# Patient Record
Sex: Male | Born: 1950
Health system: Southern US, Community
[De-identification: ages and names within clinical notes are randomized; demographics above are authoritative.]

## PROBLEM LIST (undated history)

## (undated) DIAGNOSIS — R7303 Prediabetes: Secondary | ICD-10-CM

## (undated) DIAGNOSIS — IMO0002 Reserved for concepts with insufficient information to code with codable children: Secondary | ICD-10-CM

## (undated) DIAGNOSIS — M1A9XX0 Chronic gout, unspecified, without tophus (tophi): Secondary | ICD-10-CM

## (undated) DIAGNOSIS — K449 Diaphragmatic hernia without obstruction or gangrene: Secondary | ICD-10-CM

## (undated) DIAGNOSIS — F4310 Post-traumatic stress disorder, unspecified: Secondary | ICD-10-CM

## (undated) DIAGNOSIS — M1990 Unspecified osteoarthritis, unspecified site: Secondary | ICD-10-CM

## (undated) DIAGNOSIS — G4733 Obstructive sleep apnea (adult) (pediatric): Secondary | ICD-10-CM

## (undated) DIAGNOSIS — I10 Essential (primary) hypertension: Secondary | ICD-10-CM

## (undated) DIAGNOSIS — J329 Chronic sinusitis, unspecified: Secondary | ICD-10-CM

## (undated) DIAGNOSIS — M6530 Trigger finger, unspecified finger: Secondary | ICD-10-CM

## (undated) DIAGNOSIS — J302 Other seasonal allergic rhinitis: Secondary | ICD-10-CM

## (undated) DIAGNOSIS — Z87898 Personal history of other specified conditions: Secondary | ICD-10-CM

## (undated) DIAGNOSIS — N200 Calculus of kidney: Secondary | ICD-10-CM

## (undated) DIAGNOSIS — M5136 Other intervertebral disc degeneration, lumbar region: Secondary | ICD-10-CM

## (undated) DIAGNOSIS — G8929 Other chronic pain: Secondary | ICD-10-CM

## (undated) DIAGNOSIS — K219 Gastro-esophageal reflux disease without esophagitis: Secondary | ICD-10-CM

## (undated) DIAGNOSIS — E785 Hyperlipidemia, unspecified: Secondary | ICD-10-CM

## (undated) DIAGNOSIS — R42 Dizziness and giddiness: Secondary | ICD-10-CM

## (undated) DIAGNOSIS — D649 Anemia, unspecified: Secondary | ICD-10-CM

## (undated) DIAGNOSIS — E669 Obesity, unspecified: Secondary | ICD-10-CM

## (undated) DIAGNOSIS — F1021 Alcohol dependence, in remission: Secondary | ICD-10-CM

## (undated) DIAGNOSIS — M51369 Other intervertebral disc degeneration, lumbar region without mention of lumbar back pain or lower extremity pain: Secondary | ICD-10-CM

## (undated) DIAGNOSIS — M259 Joint disorder, unspecified: Secondary | ICD-10-CM

## (undated) DIAGNOSIS — M545 Low back pain, unspecified: Secondary | ICD-10-CM

## (undated) DIAGNOSIS — F900 Attention-deficit hyperactivity disorder, predominantly inattentive type: Secondary | ICD-10-CM

## (undated) HISTORY — DX: Dizziness and giddiness: R42

## (undated) HISTORY — DX: Joint disorder, unspecified: M25.9

## (undated) HISTORY — PX: OTHER SURGICAL HISTORY: SHX169

## (undated) HISTORY — DX: Anemia, unspecified: D64.9

## (undated) HISTORY — DX: Unspecified osteoarthritis, unspecified site: M19.90

## (undated) HISTORY — DX: Post-traumatic stress disorder, unspecified: F43.10

## (undated) HISTORY — DX: Hyperlipidemia, unspecified: E78.5

## (undated) HISTORY — PX: LUMBAR MICRODISCECTOMY: SHX99

## (undated) HISTORY — DX: Chronic sinusitis, unspecified: J32.9

## (undated) HISTORY — PX: WISDOM TOOTH EXTRACTION: SHX21

## (undated) HISTORY — DX: Other seasonal allergic rhinitis: J30.2

## (undated) HISTORY — DX: Reserved for concepts with insufficient information to code with codable children: IMO0002

## (undated) HISTORY — DX: Obesity, unspecified: E66.9

## (undated) HISTORY — DX: Calculus of kidney: N20.0

## (undated) HISTORY — DX: Attention-deficit hyperactivity disorder, predominantly inattentive type: F90.0

---

## 1993-06-24 HISTORY — PX: LUMBAR FUSION: SHX111

## 1998-05-17 ENCOUNTER — Encounter: Payer: Self-pay | Admitting: Neurological Surgery

## 1998-05-17 ENCOUNTER — Ambulatory Visit (HOSPITAL_COMMUNITY): Admission: RE | Admit: 1998-05-17 | Discharge: 1998-05-17 | Payer: Self-pay | Admitting: Neurological Surgery

## 1998-05-31 ENCOUNTER — Encounter: Payer: Self-pay | Admitting: Neurological Surgery

## 1998-05-31 ENCOUNTER — Ambulatory Visit (HOSPITAL_COMMUNITY): Admission: RE | Admit: 1998-05-31 | Discharge: 1998-05-31 | Payer: Self-pay | Admitting: Neurological Surgery

## 1998-06-14 ENCOUNTER — Encounter: Payer: Self-pay | Admitting: Neurological Surgery

## 1998-06-14 ENCOUNTER — Ambulatory Visit (HOSPITAL_COMMUNITY): Admission: RE | Admit: 1998-06-14 | Discharge: 1998-06-14 | Payer: Self-pay | Admitting: Neurological Surgery

## 1998-12-21 ENCOUNTER — Ambulatory Visit (HOSPITAL_BASED_OUTPATIENT_CLINIC_OR_DEPARTMENT_OTHER): Admission: RE | Admit: 1998-12-21 | Discharge: 1998-12-21 | Payer: Self-pay | Admitting: *Deleted

## 2000-05-10 ENCOUNTER — Ambulatory Visit (HOSPITAL_COMMUNITY): Admission: RE | Admit: 2000-05-10 | Discharge: 2000-05-10 | Payer: Self-pay | Admitting: Neurological Surgery

## 2000-05-16 ENCOUNTER — Encounter: Payer: Self-pay | Admitting: Neurological Surgery

## 2000-05-16 ENCOUNTER — Encounter: Admission: RE | Admit: 2000-05-16 | Discharge: 2000-05-16 | Payer: Self-pay | Admitting: Neurological Surgery

## 2000-06-24 HISTORY — PX: FOOT SURGERY: SHX648

## 2005-08-06 ENCOUNTER — Encounter: Admission: RE | Admit: 2005-08-06 | Discharge: 2005-08-06 | Payer: Self-pay | Admitting: Neurology

## 2006-03-19 ENCOUNTER — Encounter: Admission: RE | Admit: 2006-03-19 | Discharge: 2006-03-19 | Payer: Self-pay | Admitting: Family Medicine

## 2006-03-24 ENCOUNTER — Ambulatory Visit: Payer: Self-pay

## 2006-03-24 ENCOUNTER — Encounter: Payer: Self-pay | Admitting: Internal Medicine

## 2006-06-18 ENCOUNTER — Encounter: Admission: RE | Admit: 2006-06-18 | Discharge: 2006-06-30 | Payer: Self-pay | Admitting: Family Medicine

## 2006-06-24 HISTORY — PX: SHOULDER ARTHROSCOPY: SHX128

## 2007-08-01 ENCOUNTER — Encounter: Admission: RE | Admit: 2007-08-01 | Discharge: 2007-08-01 | Payer: Self-pay | Admitting: Neurological Surgery

## 2007-08-19 ENCOUNTER — Ambulatory Visit (HOSPITAL_COMMUNITY): Admission: RE | Admit: 2007-08-19 | Discharge: 2007-08-19 | Payer: Self-pay | Admitting: Neurological Surgery

## 2009-03-15 ENCOUNTER — Encounter (INDEPENDENT_AMBULATORY_CARE_PROVIDER_SITE_OTHER): Payer: Self-pay | Admitting: *Deleted

## 2009-03-22 ENCOUNTER — Ambulatory Visit: Payer: Self-pay | Admitting: Gastroenterology

## 2009-04-05 ENCOUNTER — Ambulatory Visit: Payer: Self-pay | Admitting: Gastroenterology

## 2009-08-16 ENCOUNTER — Encounter: Admission: RE | Admit: 2009-08-16 | Discharge: 2009-08-16 | Payer: Self-pay | Admitting: Family Medicine

## 2010-02-23 ENCOUNTER — Encounter: Admission: RE | Admit: 2010-02-23 | Discharge: 2010-02-23 | Payer: Self-pay | Admitting: Family Medicine

## 2011-03-25 DIAGNOSIS — Z87442 Personal history of urinary calculi: Secondary | ICD-10-CM

## 2011-03-25 DIAGNOSIS — N200 Calculus of kidney: Secondary | ICD-10-CM

## 2011-03-25 HISTORY — DX: Personal history of urinary calculi: Z87.442

## 2011-03-25 HISTORY — DX: Calculus of kidney: N20.0

## 2011-04-10 ENCOUNTER — Ambulatory Visit
Admission: RE | Admit: 2011-04-10 | Discharge: 2011-04-10 | Disposition: A | Discharge status: Home / Self Care | Payer: Medicare Other | Source: Ambulatory Visit | Attending: Family Medicine | Admitting: Family Medicine

## 2011-04-10 ENCOUNTER — Other Ambulatory Visit: Payer: Self-pay | Admitting: Family Medicine

## 2011-06-27 DIAGNOSIS — R5381 Other malaise: Secondary | ICD-10-CM | POA: Diagnosis not present

## 2011-06-27 DIAGNOSIS — I10 Essential (primary) hypertension: Secondary | ICD-10-CM | POA: Diagnosis not present

## 2011-11-15 DIAGNOSIS — IMO0002 Reserved for concepts with insufficient information to code with codable children: Secondary | ICD-10-CM | POA: Diagnosis not present

## 2011-11-15 DIAGNOSIS — M5137 Other intervertebral disc degeneration, lumbosacral region: Secondary | ICD-10-CM | POA: Diagnosis not present

## 2011-11-29 DIAGNOSIS — IMO0002 Reserved for concepts with insufficient information to code with codable children: Secondary | ICD-10-CM | POA: Diagnosis not present

## 2011-11-29 DIAGNOSIS — M5137 Other intervertebral disc degeneration, lumbosacral region: Secondary | ICD-10-CM | POA: Diagnosis not present

## 2011-11-29 DIAGNOSIS — M545 Low back pain, unspecified: Secondary | ICD-10-CM | POA: Diagnosis not present

## 2012-04-17 DIAGNOSIS — F988 Other specified behavioral and emotional disorders with onset usually occurring in childhood and adolescence: Secondary | ICD-10-CM | POA: Diagnosis not present

## 2012-06-25 DIAGNOSIS — E559 Vitamin D deficiency, unspecified: Secondary | ICD-10-CM | POA: Diagnosis not present

## 2012-06-25 DIAGNOSIS — F988 Other specified behavioral and emotional disorders with onset usually occurring in childhood and adolescence: Secondary | ICD-10-CM | POA: Diagnosis not present

## 2012-06-25 DIAGNOSIS — F329 Major depressive disorder, single episode, unspecified: Secondary | ICD-10-CM | POA: Diagnosis not present

## 2012-06-25 DIAGNOSIS — E7800 Pure hypercholesterolemia, unspecified: Secondary | ICD-10-CM | POA: Diagnosis not present

## 2012-06-25 DIAGNOSIS — I10 Essential (primary) hypertension: Secondary | ICD-10-CM | POA: Diagnosis not present

## 2012-09-23 ENCOUNTER — Other Ambulatory Visit: Payer: Self-pay

## 2012-09-23 MED ORDER — METHYLPHENIDATE HCL 10 MG PO TABS: 10.0000 mg | ORAL_TABLET | Freq: Every day | ORAL | Status: DC

## 2012-09-23 NOTE — Telephone Encounter (Signed)
Patient calling to request refill on Ritalin.  He would like the rx mailed to his [address].

## 2012-10-12 DIAGNOSIS — M545 Low back pain, unspecified: Secondary | ICD-10-CM | POA: Diagnosis not present

## 2012-11-05 DIAGNOSIS — N400 Enlarged prostate without lower urinary tract symptoms: Secondary | ICD-10-CM | POA: Diagnosis not present

## 2012-12-16 DIAGNOSIS — E559 Vitamin D deficiency, unspecified: Secondary | ICD-10-CM | POA: Diagnosis not present

## 2012-12-16 DIAGNOSIS — R7301 Impaired fasting glucose: Secondary | ICD-10-CM | POA: Diagnosis not present

## 2012-12-16 DIAGNOSIS — I10 Essential (primary) hypertension: Secondary | ICD-10-CM | POA: Diagnosis not present

## 2012-12-16 DIAGNOSIS — E785 Hyperlipidemia, unspecified: Secondary | ICD-10-CM | POA: Diagnosis not present

## 2012-12-16 DIAGNOSIS — Z125 Encounter for screening for malignant neoplasm of prostate: Secondary | ICD-10-CM | POA: Diagnosis not present

## 2013-01-01 ENCOUNTER — Other Ambulatory Visit: Payer: Self-pay

## 2013-01-01 MED ORDER — METHYLPHENIDATE HCL 10 MG PO TABS: 10.0000 mg | ORAL_TABLET | Freq: Every day | ORAL | Status: DC

## 2013-01-01 NOTE — Telephone Encounter (Signed)
Patient called, left message requesting a refill on Ritalin.  Call back number with any questions is [phone].

## 2013-02-14 ENCOUNTER — Other Ambulatory Visit: Payer: Self-pay | Admitting: Family Medicine

## 2013-02-14 DIAGNOSIS — M549 Dorsalgia, unspecified: Secondary | ICD-10-CM

## 2013-02-24 ENCOUNTER — Other Ambulatory Visit: Payer: Medicare Other

## 2013-03-02 DIAGNOSIS — B351 Tinea unguium: Secondary | ICD-10-CM | POA: Diagnosis not present

## 2013-03-02 DIAGNOSIS — L600 Ingrowing nail: Secondary | ICD-10-CM | POA: Diagnosis not present

## 2013-03-02 DIAGNOSIS — M79609 Pain in unspecified limb: Secondary | ICD-10-CM | POA: Diagnosis not present

## 2013-03-31 ENCOUNTER — Encounter: Payer: Self-pay | Admitting: Nurse Practitioner

## 2013-04-07 ENCOUNTER — Encounter (INDEPENDENT_AMBULATORY_CARE_PROVIDER_SITE_OTHER): Payer: Self-pay

## 2013-04-07 ENCOUNTER — Encounter: Payer: Self-pay | Admitting: Nurse Practitioner

## 2013-04-07 ENCOUNTER — Ambulatory Visit (INDEPENDENT_AMBULATORY_CARE_PROVIDER_SITE_OTHER): Payer: Medicare Other | Admitting: Nurse Practitioner

## 2013-04-07 VITALS — BP 132/88 | HR 78 | Temp 97.6°F | Ht 73.0 in | Wt 233.0 lb

## 2013-04-07 DIAGNOSIS — R42 Dizziness and giddiness: Secondary | ICD-10-CM | POA: Diagnosis not present

## 2013-04-07 DIAGNOSIS — M5126 Other intervertebral disc displacement, lumbar region: Secondary | ICD-10-CM

## 2013-04-07 DIAGNOSIS — H8309 Labyrinthitis, unspecified ear: Secondary | ICD-10-CM

## 2013-04-07 DIAGNOSIS — I10 Essential (primary) hypertension: Secondary | ICD-10-CM | POA: Insufficient documentation

## 2013-04-07 DIAGNOSIS — M5137 Other intervertebral disc degeneration, lumbosacral region: Secondary | ICD-10-CM | POA: Diagnosis not present

## 2013-04-07 DIAGNOSIS — F988 Other specified behavioral and emotional disorders with onset usually occurring in childhood and adolescence: Secondary | ICD-10-CM | POA: Diagnosis not present

## 2013-04-07 MED ORDER — METHYLPHENIDATE HCL 10 MG PO TABS: 10.0000 mg | ORAL_TABLET | Freq: Every day | ORAL | Status: DC

## 2013-04-07 NOTE — Progress Notes (Signed)
[facility]  PATIENT: Stephen York DOB: [birth year]   REASON FOR VISIT: follow up HISTORY FROM: patient  HISTORY OF PRESENT ILLNESS: UPDATE 04/07/13 (LL): Stephen York returns for yearly follow up for ADD.  He has no complaints.   04/17/12: Stephen York is a 62 year old male here for his yearly follow up for ADD.  He reports his concentration has been better with his Ritalin.    PRIOR HPI (Dr. Vickey Huger): He has a history of hearing loss and ringing in the ear,  he has chronic sinus problems.  He sees a dermatologist for his eczema and rash on his face. He also has had three back surgeries in the past and was found six to eight months ago to have another ruptured disc, however, he is being treated conservatively by Dr. Danielle Dess.  He has a history of high blood pressure, high cholesterol and posttraumatic stress disorder.  Review of Systems  Out of a complete 14 system review, the patient complains of only the following symptoms, and all other reviewed systems are negative.  Respiratory: Snoring   Endocrine: Feeling hot   Sleep: Snoring   ENT: Hearing loss   Ringing in ears   Skin: Rash   Birthmarks  Itching   moles   Musculoskeletal: Joint pain   Aching muscles   Allergy/Immunology: Allergies   Runny nose   Genitourinary: urination problems  Impotence   Neurological: memory loss  ALLERGIES: Allergies  Allergen Reactions  . Lipitor [Atorvastatin]     HOME MEDICATIONS: Outpatient Prescriptions Prior to Visit  Medication Sig Dispense Refill  . ALPRAZolam (XANAX) 0.5 MG tablet Take 0.5 mg by mouth at bedtime as needed for sleep.      Marland Kitchen amLODipine (NORVASC) 10 MG tablet Take 10 mg by mouth daily.      Marland Kitchen aspirin 81 MG chewable tablet Chew 81 mg by mouth daily.      . cholecalciferol (VITAMIN D) 1000 UNITS tablet Take 1,000 Units by mouth daily.      . colesevelam (WELCHOL) 625 MG tablet Take 1,875 mg by mouth 2 (two) times daily with a meal.      . cyclobenzaprine  (FLEXERIL) 10 MG tablet Take 10 mg by mouth 3 (three) times daily as needed for muscle spasms.      . fenofibrate 160 MG tablet Take 160 mg by mouth daily.      . hydrochlorothiazide (HYDRODIURIL) 25 MG tablet Take 25 mg by mouth daily.      Marland Kitchen lisinopril (PRINIVIL,ZESTRIL) 10 MG tablet Take 10 mg by mouth daily. Take half tablet daily.      . meloxicam (MOBIC) 15 MG tablet Take 15 mg by mouth daily.      . methocarbamol (ROBAXIN) 500 MG tablet Take 500 mg by mouth 3 (three) times daily.      . sertraline (ZOLOFT) 25 MG tablet Take 25 mg by mouth daily.      . vitamin C (ASCORBIC ACID) 500 MG tablet Take 500 mg by mouth daily.      . vitamin E 1000 UNIT capsule Take 1,000 Units by mouth daily.      . methylphenidate (RITALIN) 10 MG tablet Take 1 tablet (10 mg total) by mouth daily. BRAND MEDICALLY NECESSARY  90 tablet  0  . Flaxseed, Linseed, (FLAX SEED OIL) 1000 MG CAPS Take 1,000 capsules by mouth daily.       No facility-administered medications prior to visit.    PAST MEDICAL HISTORY: Past  Medical History  Diagnosis Date  . Kidney stones october 2012    passed it  . Hyperlipidemia   . PTSD (post-traumatic stress disorder)   . Obesity   . Dizziness   . Degenerative disk disease   . Chronic sinusitis   . ADD (attention deficit hyperactivity disorder, inattentive type)     PAST SURGICAL HISTORY: Past Surgical History  Procedure Laterality Date  . Back surgeries      3    FAMILY HISTORY: Family History  Problem Relation Age of Onset  .       SOCIAL HISTORY: History   Social History  . Marital Status: Married    Spouse Name: Samara Deist    Number of Children: 2  . Years of Education: 12   Occupational History  . retired    Social History Main Topics  . Smoking status: Former Smoker    Quit date: 09/29/1992  . Smokeless tobacco: Not on file  . Alcohol Use: No  . Drug Use: No  . Sexual Activity: No   Other Topics Concern  . Not on file   Social History  Narrative  . No narrative on file     PHYSICAL EXAM  Filed Vitals:   04/07/13 1021  BP: 132/88  Pulse: 78  Temp: 97.6 F (36.4 C)  TempSrc: Oral  Height: 6\' 1"  (1.854 m)  Weight: 233 lb (105.688 kg)   Body mass index is 30.75 kg/(m^2).  Physical Exam  General: Patient is awake, alert and in no acute distress.  Well developed and groomed. Head: normocephlic  Ears, Nose and Throat: mallompati 3  Neck: Neck is supple. Respiratory: LCTA Cardiovascular: No carotid artery bruits.  Heart is regular rate and rhythm with no murmurs. Musculoskeletal: normal Skin: erythema around lips, chin  and nasolabial folds.  Neurologic Exam  Mental Status: Awake, alert and oriented to person, place and time.   Recent and remote memory, attention span, concentration and fund of knowledge are normal.  Language is fluent and comprehension intact. Cranial Nerves: .  Pupils are equal and reactive to light.  Visual fields are full to confrontation.   Hearing is intact, compensated with a hearing aid in the left ear, has equal bone and air conduction.  Palate elevated symmetrically and uvula is midline.  Shoulder shrug is symmetric.  Tongue is midline. Motor: Normal bulk and tone.  Full strength in the upper extremities.   He has chronic back pain and cannot lift over 20 pounds. Left L4-5 to S1 is known, has radiculopathy.  Status post three back surgeries.  Sensory: Intact and symmetric to light touch and temperature Coordination: No ataxia or dysmetria on finger-nose  Gait and Station: Narrow based gait.  Tandem gait is stable.  Able to walk on toes and heels. Romberg negative. Reflexes: Deep tendon reflexes in the upper and lower extremities are present.  Bilateral patellar trace.     DIAGNOSTIC DATA (LABS, IMAGING, TESTING) - I reviewed patient records, labs, notes, testing and imaging myself where available.  ASSESSMENT AND PLAN  62 y.o. year old male  has a past medical history of Kidney  stones (october 2012); Hyperlipidemia; PTSD (post-traumatic stress disorder); Obesity; Dizziness; Degenerative disk disease; Chronic sinusitis; and ADD (attention deficit hyperactivity disorder, inattentive type). here for follow up of ADD.  PLAN: Rv regular q 12 month - Refill Ritalin for 90 day, printed script given.   Meds ordered this encounter  Medications  . methylphenidate (RITALIN) 10 MG tablet  Sig: Take 1 tablet (10 mg total) by mouth daily. BRAND MEDICALLY NECESSARY    Dispense:  90 tablet    Refill:  0    Mail to Patient    Order Specific Question:  Supervising Provider    Answer:  Vickey Huger, CARMEN [[id number]]    Tawny Asal Eudora Guevarra, MSN, NP-C 04/07/2013, 11:03 AM [facility] [address] [place], [place] [address] [phone]

## 2013-04-07 NOTE — Patient Instructions (Addendum)
Regular revisit  every 12 month -  Continue medications, Ritalin for 90 day.

## 2013-06-29 ENCOUNTER — Other Ambulatory Visit: Payer: Self-pay

## 2013-06-29 MED ORDER — METHYLPHENIDATE HCL 10 MG PO TABS: 10.0000 mg | ORAL_TABLET | Freq: Every day | ORAL | Status: DC

## 2013-06-29 NOTE — Telephone Encounter (Signed)
Called patient and notified that Rx going in mail today. Will be picked up tomorrow a.m. by postal service for delivery.

## 2013-07-05 ENCOUNTER — Telehealth: Payer: Self-pay | Admitting: Neurology

## 2013-07-05 NOTE — Telephone Encounter (Signed)
I called back.  Spoke to the pharmacist,  She said the patient has always gotten generic and they do not have Brand Name in stock.  Gave verbal auth to continue dispensing what the patient previously had.

## 2013-07-05 NOTE — Telephone Encounter (Signed)
Pharmacy called regarding methylphenidate (RITALIN) 10 MG tablet RX.  The RX states to use brand name, however the patient has been given the generic in the past.  They wanted to see if it was ok to give generic.  Please call.  Thank you

## 2013-07-09 DIAGNOSIS — Z79899 Other long term (current) drug therapy: Secondary | ICD-10-CM | POA: Diagnosis not present

## 2013-07-09 DIAGNOSIS — E559 Vitamin D deficiency, unspecified: Secondary | ICD-10-CM | POA: Diagnosis not present

## 2013-07-09 DIAGNOSIS — E782 Mixed hyperlipidemia: Secondary | ICD-10-CM | POA: Diagnosis not present

## 2013-07-13 DIAGNOSIS — K649 Unspecified hemorrhoids: Secondary | ICD-10-CM | POA: Diagnosis not present

## 2013-08-11 DIAGNOSIS — M545 Low back pain, unspecified: Secondary | ICD-10-CM | POA: Diagnosis not present

## 2013-08-11 DIAGNOSIS — M5137 Other intervertebral disc degeneration, lumbosacral region: Secondary | ICD-10-CM | POA: Diagnosis not present

## 2013-09-17 ENCOUNTER — Other Ambulatory Visit: Payer: Self-pay | Admitting: Neurology

## 2013-09-17 MED ORDER — METHYLPHENIDATE HCL 10 MG PO TABS: 10.0000 mg | ORAL_TABLET | Freq: Every day | ORAL | Status: DC

## 2013-09-20 NOTE — Telephone Encounter (Signed)
Called pt to inform him that his Rx was ready to be picked up at the front desk and if he has any other problems, questions or concerns to call the office. Pt verbalized understanding. °

## 2013-12-20 ENCOUNTER — Other Ambulatory Visit: Payer: Self-pay | Admitting: Neurology

## 2013-12-20 MED ORDER — METHYLPHENIDATE HCL 10 MG PO TABS: 10.0000 mg | ORAL_TABLET | Freq: Every day | ORAL | Status: DC

## 2013-12-20 NOTE — Telephone Encounter (Signed)
[facility] is calling for a written Rx for Ritalin--please call when ready for pickup--thank you.

## 2013-12-20 NOTE — Telephone Encounter (Signed)
Called pt to inform him that his Rx was ready to be picked up at the front desk and if he has any other problems, questions or concerns to call the office. Pt verbalized understanding. °

## 2013-12-20 NOTE — Telephone Encounter (Signed)
Request forwarded to provider for approval  

## 2013-12-27 DIAGNOSIS — G8929 Other chronic pain: Secondary | ICD-10-CM | POA: Diagnosis not present

## 2013-12-27 DIAGNOSIS — M79609 Pain in unspecified limb: Secondary | ICD-10-CM | POA: Diagnosis not present

## 2013-12-27 DIAGNOSIS — L089 Local infection of the skin and subcutaneous tissue, unspecified: Secondary | ICD-10-CM | POA: Diagnosis not present

## 2013-12-27 DIAGNOSIS — L600 Ingrowing nail: Secondary | ICD-10-CM | POA: Diagnosis not present

## 2014-01-11 DIAGNOSIS — M1710 Unilateral primary osteoarthritis, unspecified knee: Secondary | ICD-10-CM | POA: Diagnosis not present

## 2014-01-11 DIAGNOSIS — M25569 Pain in unspecified knee: Secondary | ICD-10-CM | POA: Diagnosis not present

## 2014-01-11 DIAGNOSIS — IMO0002 Reserved for concepts with insufficient information to code with codable children: Secondary | ICD-10-CM | POA: Diagnosis not present

## 2014-01-12 ENCOUNTER — Other Ambulatory Visit: Payer: Self-pay | Admitting: Orthopedic Surgery

## 2014-01-12 DIAGNOSIS — M25562 Pain in left knee: Secondary | ICD-10-CM

## 2014-01-18 ENCOUNTER — Ambulatory Visit
Admission: RE | Admit: 2014-01-18 | Discharge: 2014-01-18 | Disposition: A | Discharge status: Home / Self Care | Payer: Medicare Other | Source: Ambulatory Visit | Attending: Orthopedic Surgery | Admitting: Orthopedic Surgery

## 2014-01-18 DIAGNOSIS — M7120 Synovial cyst of popliteal space [Baker], unspecified knee: Secondary | ICD-10-CM | POA: Diagnosis not present

## 2014-01-18 DIAGNOSIS — M25562 Pain in left knee: Secondary | ICD-10-CM

## 2014-01-20 DIAGNOSIS — IMO0002 Reserved for concepts with insufficient information to code with codable children: Secondary | ICD-10-CM | POA: Diagnosis not present

## 2014-01-26 DIAGNOSIS — L600 Ingrowing nail: Secondary | ICD-10-CM | POA: Diagnosis not present

## 2014-01-26 DIAGNOSIS — M898X9 Other specified disorders of bone, unspecified site: Secondary | ICD-10-CM | POA: Diagnosis not present

## 2014-02-02 DIAGNOSIS — E559 Vitamin D deficiency, unspecified: Secondary | ICD-10-CM | POA: Diagnosis not present

## 2014-02-02 DIAGNOSIS — Z79899 Other long term (current) drug therapy: Secondary | ICD-10-CM | POA: Diagnosis not present

## 2014-02-02 DIAGNOSIS — Z125 Encounter for screening for malignant neoplasm of prostate: Secondary | ICD-10-CM | POA: Diagnosis not present

## 2014-02-02 DIAGNOSIS — I10 Essential (primary) hypertension: Secondary | ICD-10-CM | POA: Diagnosis not present

## 2014-02-03 DIAGNOSIS — Z23 Encounter for immunization: Secondary | ICD-10-CM | POA: Diagnosis not present

## 2014-02-03 DIAGNOSIS — E785 Hyperlipidemia, unspecified: Secondary | ICD-10-CM | POA: Diagnosis not present

## 2014-02-03 DIAGNOSIS — F329 Major depressive disorder, single episode, unspecified: Secondary | ICD-10-CM | POA: Diagnosis not present

## 2014-02-03 DIAGNOSIS — R7301 Impaired fasting glucose: Secondary | ICD-10-CM | POA: Diagnosis not present

## 2014-02-03 DIAGNOSIS — I10 Essential (primary) hypertension: Secondary | ICD-10-CM | POA: Diagnosis not present

## 2014-02-03 DIAGNOSIS — F988 Other specified behavioral and emotional disorders with onset usually occurring in childhood and adolescence: Secondary | ICD-10-CM | POA: Diagnosis not present

## 2014-02-03 DIAGNOSIS — E559 Vitamin D deficiency, unspecified: Secondary | ICD-10-CM | POA: Diagnosis not present

## 2014-02-23 DIAGNOSIS — M5137 Other intervertebral disc degeneration, lumbosacral region: Secondary | ICD-10-CM | POA: Diagnosis not present

## 2014-03-21 ENCOUNTER — Other Ambulatory Visit: Payer: Self-pay | Admitting: *Deleted

## 2014-03-21 MED ORDER — METHYLPHENIDATE HCL 10 MG PO TABS: 10.0000 mg | ORAL_TABLET | Freq: Every day | ORAL | Status: DC

## 2014-03-21 NOTE — Telephone Encounter (Signed)
Request entered and forwarded to provider for approval  

## 2014-03-21 NOTE — Telephone Encounter (Signed)
Calling for refill of ritalin, Pick up this Wednesday or Thursday.

## 2014-03-22 NOTE — Telephone Encounter (Signed)
Called pt and relayed that prescription ready for pick up.  (ritalin 10mg  po daily, #90).  She verbalized understanding.

## 2014-04-07 ENCOUNTER — Ambulatory Visit: Payer: Medicare Other | Admitting: Neurology

## 2014-06-14 ENCOUNTER — Other Ambulatory Visit: Payer: Self-pay | Admitting: Neurology

## 2014-06-14 MED ORDER — METHYLPHENIDATE HCL 10 MG PO TABS: 10.0000 mg | ORAL_TABLET | Freq: Every day | ORAL | Status: DC

## 2014-06-14 NOTE — Telephone Encounter (Signed)
Pt is calling requesting a written Rx for methylphenidate (RITALIN) 10 MG tablet. Please call when ready for pick up.

## 2014-06-14 NOTE — Telephone Encounter (Signed)
Dr Dohmeier is out of the office.  Request entered, forwarded to WID for approval.  

## 2014-06-15 ENCOUNTER — Telehealth: Payer: Self-pay

## 2014-06-15 NOTE — Telephone Encounter (Signed)
Spoke to patient. Advised Rx ready for pick up.

## 2014-06-24 HISTORY — PX: KNEE ARTHROSCOPY: SUR90

## 2014-06-27 DIAGNOSIS — L820 Inflamed seborrheic keratosis: Secondary | ICD-10-CM | POA: Diagnosis not present

## 2014-06-27 DIAGNOSIS — L219 Seborrheic dermatitis, unspecified: Secondary | ICD-10-CM | POA: Diagnosis not present

## 2014-06-30 ENCOUNTER — Ambulatory Visit: Payer: Medicare Other | Admitting: Neurology

## 2014-07-01 ENCOUNTER — Encounter: Payer: Self-pay | Admitting: Neurology

## 2014-07-01 ENCOUNTER — Ambulatory Visit (INDEPENDENT_AMBULATORY_CARE_PROVIDER_SITE_OTHER): Payer: Medicare Other | Admitting: Neurology

## 2014-07-01 VITALS — BP 135/92 | HR 81 | Resp 16 | Ht 72.0 in | Wt 234.0 lb

## 2014-07-01 DIAGNOSIS — I10 Essential (primary) hypertension: Secondary | ICD-10-CM | POA: Insufficient documentation

## 2014-07-01 DIAGNOSIS — F900 Attention-deficit hyperactivity disorder, predominantly inattentive type: Secondary | ICD-10-CM

## 2014-07-01 MED ORDER — METHYLPHENIDATE HCL 10 MG PO TABS: 10.0000 mg | ORAL_TABLET | Freq: Every day | ORAL | Status: DC

## 2014-07-01 NOTE — Progress Notes (Signed)
[facility]  PATIENT: Stephen York DOB: [birth year]   REASON FOR VISIT: follow up HISTORY FROM: patient  HISTORY OF PRESENT ILLNESS:  07-01-13;  Stephen York returns for yearly follow up for ADD.  He has no complaints.  Need refills . Has  HTN , but no chest pain., headaches, No diaphoresis , no diarrhea.   04/17/12: Stephen York is a 64 year old male here for his yearly follow up for ADD.  He reports his concentration has been better with his Ritalin.    PRIOR HPI (Dr. Vickey Hugerohmeier): He has a history of hearing loss and ringing in the ear,  he has chronic sinus problems.  He sees a dermatologist for his eczema and rash on his face. He also has had three back surgeries in the past and was found six to eight months ago to have another ruptured disc, however, he is being treated conservatively by Dr. Danielle DessElsner.  He has a history of high blood pressure, high cholesterol and posttraumatic stress disorder.  Review of Systems  Out of a complete 14 system review, the patient complains of only the following symptoms, and all other reviewed systems are negative.  Respiratory: Snoring   Endocrine: Feeling hot   Sleep: Snoring   ENT: Hearing loss   Ringing in ears   Skin: Rash   Birthmarks  Itching   moles   Musculoskeletal: Joint pain   Aching muscles   Allergy/Immunology: Allergies   Runny nose   Genitourinary: urination problems  Impotence   Neurological: memory loss  ALLERGIES: Allergies  Allergen Reactions  . Lipitor [Atorvastatin]     HOME MEDICATIONS: Outpatient Prescriptions Prior to Visit  Medication Sig Dispense Refill  . ALPRAZolam (XANAX) 0.5 MG tablet Take 0.5 mg by mouth at bedtime as needed for sleep.      Marland Kitchen. amLODipine (NORVASC) 10 MG tablet Take 10 mg by mouth daily.      Marland Kitchen. aspirin 81 MG chewable tablet Chew 81 mg by mouth daily.      . cholecalciferol (VITAMIN D) 1000 UNITS tablet Take 1,000 Units by mouth daily.      . colesevelam (WELCHOL) 625 MG tablet  Take 1,875 mg by mouth 2 (two) times daily with a meal.      . cyclobenzaprine (FLEXERIL) 10 MG tablet Take 10 mg by mouth 3 (three) times daily as needed for muscle spasms.      . fenofibrate 160 MG tablet Take 160 mg by mouth daily.      . hydrochlorothiazide (HYDRODIURIL) 25 MG tablet Take 25 mg by mouth daily.      Marland Kitchen. lisinopril (PRINIVIL,ZESTRIL) 10 MG tablet Take 10 mg by mouth daily. Take half tablet daily.      . meloxicam (MOBIC) 15 MG tablet Take 15 mg by mouth daily.      . methocarbamol (ROBAXIN) 500 MG tablet Take 500 mg by mouth 3 (three) times daily.      . sertraline (ZOLOFT) 25 MG tablet Take 25 mg by mouth daily.      . vitamin C (ASCORBIC ACID) 500 MG tablet Take 500 mg by mouth daily.      . vitamin E 1000 UNIT capsule Take 1,000 Units by mouth daily.      . methylphenidate (RITALIN) 10 MG tablet Take 1 tablet (10 mg total) by mouth daily. BRAND MEDICALLY NECESSARY  90 tablet  0  . Flaxseed, Linseed, (FLAX SEED OIL) 1000 MG CAPS Take 1,000 capsules by mouth daily.  No facility-administered medications prior to visit.    PAST MEDICAL HISTORY: Past Medical History  Diagnosis Date  . Kidney stones october 2012    passed it  . Hyperlipidemia   . PTSD (post-traumatic stress disorder)   . Obesity   . Dizziness   . Degenerative disk disease   . Chronic sinusitis   . ADD (attention deficit hyperactivity disorder, inattentive type)     e  . Knee problem     left knee    PAST SURGICAL HISTORY: Past Surgical History  Procedure Laterality Date  . Back surgeries      3    FAMILY HISTORY: History reviewed. No pertinent family history.  SOCIAL HISTORY: History   Social History  . Marital Status: Married    Spouse Name: Samara Deist    Number of Children: 2  . Years of Education: 12   Occupational History  . retired    Social History Main Topics  . Smoking status: Former Smoker    Quit date: 09/29/1992  . Smokeless tobacco: Not on file  . Alcohol Use: No       Comment: quit in 1981  . Drug Use: No  . Sexual Activity: No   Other Topics Concern  . Not on file   Social History Narrative   Caffeine 3 cups in am, Married, 2 kids. Retired.       PHYSICAL EXAM  Filed Vitals:   07/01/14 1104  BP: 135/92  Pulse: 81  Resp: 16  Height: 6' (1.829 m)  Weight: 234 lb (106.142 kg)   Body mass index is 31.73 kg/(m^2).  Physical Exam  General: Patient is awake, alert and in no acute distress.  Well developed and groomed. Head: normocephlic  Ears, Nose and Throat: mallompati 3  Neck: Neck is supple. Respiratory: LCTA Cardiovascular: No carotid artery bruits.  Heart is regular rate and rhythm with no murmurs. Musculoskeletal: normal Skin: erythema around lips, chin  and nasolabial folds.  Neurologic Exam  Mental Status: Awake, alert and oriented to person, place and time.  Recent and remote memory, attention span is fluctuating,  Concentration and fund of knowledge are normal.   Language is fluent and comprehension intact.  Cranial Nerves: Pupils are equal and reactive to light.  Visual fields are full to confrontation.   Hearing is intact, compensated with a hearing aid in the left ear, has equal bone and air conduction.  Palate elevated symmetrically and uvula is midline.  Shoulder shrug is symmetric.  Tongue is midline.  Motor: Normal bulk and tone. No cog wheeling. shouder rOM restricted on the right, and hip flexion due to pain restricted on the right.  Full strength in the upper extremities. He has chronic back pain and cannot lift over 20 pounds.  Left L4-5 to S1 is known, has  Chronic radiculopathy.  Status post three back surgeries- he has been living with the pain within his limits/ Dr.  Danielle Dess .  Sensory: Intact and symmetric to light touch and temperature Coordination: No ataxia or dysmetria on finger-nose  Gait and Station: wider based gait, able able to stand on toes, but not on heels. Knee and hip pain restrict him as  much as back pain.  Reflexes: Deep tendon reflexes in the upper and lower extremities are present.  Bilateral patellar reflexes are strong - no clonus. Babinski deferred.    DIAGNOSTIC DATA (LABS, IMAGING, TESTING) - I reviewed patient records, labs, notes, testing and imaging myself where available.  Visit duration 25  minutes with 50% of the time  or more spent in face to face discussion, information and treatment planning.   ASSESSMENT AND PLAN 1) multifocal arthritis,  2) gait diorder related to DDD, radiculopathy.  64 y.o. year old male  here for follow up of ADD.  PLAN: Rv regular q 12 month - Refill Ritalin for 90 day, printed script given.  Patient has been compliant, never ran out early , no lost prescription.    Meds ordered this encounter  Medications  . methylphenidate (RITALIN) 10 MG tablet    Sig: Take 1 tablet (10 mg total) by mouth daily. BRAND MEDICALLY NECESSARY    Dispense:  90 tablet    Refill:  0    Mail to Patient    Order Specific Question:  Supervising Provider    Answer:  Melvyn Novas [[id number]]    07/01/2014, 11:27 AM     [facility] [address], [address] [place], [place] [address] [phone]

## 2014-07-01 NOTE — Patient Instructions (Signed)
Methylphenidate tablets What is this medicine? METHYLPHENIDATE (meth il FEN i date) is used to treat attention-deficit hyperactivity disorder (ADHD). It is also used to treat narcolepsy. This medicine may be used for other purposes; ask your health care provider or pharmacist if you have questions. COMMON BRAND NAME(S): Methylin, Ritalin What should I tell my health care provider before I take this medicine? They need to know if you have any of these conditions: -anxiety or panic attacks -circulation problems in fingers and toes -glaucoma -hardening or blockages of the arteries or heart blood vessels -heart disease or a heart defect -high blood pressure -history of a drug or alcohol abuse problem -history of stroke -liver disease -mental illness -motor tics, family history or diagnosis of Tourette's syndrome -seizures -suicidal thoughts, plans, or attempt; a previous suicide attempt by you or a family member -thyroid disease -an unusual or allergic reaction to methylphenidate, other medicines, foods, dyes, or preservatives -pregnant or trying to get pregnant -breast-feeding How should I use this medicine? Take this medicine by mouth with a glass of water. Follow the directions on the prescription label. It is best to take this medicine 30 to 45 minutes before meals, unless your doctor tells you otherwise. Take your medicine at regular intervals. Usually the last dose of the day will be taken at least 4 to 6 hours before bedtime, so it will not interfere with sleep. Do not take your medicine more often than directed. A special MedGuide will be given to you by the pharmacist with each prescription and refill. Be sure to read this information carefully each time. Talk to your pediatrician regarding the use of this medicine in children. While this drug may be prescribed for children as young as 6 years of age for selected conditions, precautions do apply. Overdosage: If you think you have  taken too much of this medicine contact a poison control center or emergency room at once. NOTE: This medicine is only for you. Do not share this medicine with others. What if I miss a dose? If you miss a dose, take it as soon as you can. If it is almost time for your next dose, take only that dose. Do not take double or extra doses. What may interact with this medicine? Do not take this medicine with any of the following medications: -lithium -MAOIs like Carbex, Eldepryl, Marplan, Nardil, and Parnate -other stimulant medicines for attention disorders, weight loss, or to stay awake -procarbazine This medicine may also interact with the following medications: -atomoxetine -caffeine -certain medicines for blood pressure, heart disease, irregular heart beat -certain medicines for depression, anxiety, or psychotic disturbances -certain medicines for seizures like carbamazepine, phenobarbital, phenytoin -cold or allergy medicines -warfarin This list may not describe all possible interactions. Give your health care provider a list of all the medicines, herbs, non-prescription drugs, or dietary supplements you use. Also tell them if you smoke, drink alcohol, or use illegal drugs. Some items may interact with your medicine. What should I watch for while using this medicine? Visit your doctor or health care professional for regular checks on your progress. This prescription requires that you follow special procedures with your doctor and pharmacy. You will need to have a new written prescription from your doctor or health care professional every time you need a refill. This medicine may affect your concentration, or hide signs of tiredness. Until you know how this drug affects you, do not drive, ride a bicycle, use machinery, or do anything that needs mental alertness.   Tell your doctor or health care professional if this medicine loses its effects, or if you feel you need to take more than the  prescribed amount. Do not change the dosage without talking to your doctor or health care professional. For males, contact your doctor or health care professional right away if you have an erection that lasts longer than 4 hours or if it becomes painful. This may be a sign of a serious problem and must be treated right away to prevent permanent damage. Decreased appetite is a common side effect when starting this medicine. Eating small, frequent meals or snacks can help. Talk to your doctor if you continue to have poor eating habits. Height and weight growth of a child taking this medicine will be monitored closely. Do not take this medicine close to bedtime. It may prevent you from sleeping. If you are going to need surgery, a MRI, CT scan, or other procedure, tell your doctor that you are taking this medicine. You may need to stop taking this medicine before the procedure. Tell your doctor or healthcare professional right away if you notice unexplained wounds on your fingers and toes while taking this medicine. You should also tell your healthcare provider if you experience numbness or pain, changes in the skin color, or sensitivity to temperature in your fingers or toes. What side effects may I notice from receiving this medicine? Side effects that you should report to your doctor or health care professional as soon as possible: -allergic reactions like skin rash, itching or hives, swelling of the face, lips, or tongue -changes in vision -chest pain or chest tightness -fast, irregular heartbeat -fingers or toes feel numb, cool, painful -hallucination, loss of contact with reality -high blood pressure -males: prolonged or painful erection -seizures -severe headaches -shortness of breath -suicidal thoughts or other mood changes -trouble walking, dizziness, loss of balance or coordination -uncontrollable head, mouth, neck, arm, or leg movements -unusual bleeding or bruising Side effects that  usually do not require medical attention (report to your doctor or health care professional if they continue or are bothersome): -anxious -headache -loss of appetite -nausea, vomiting -trouble sleeping -weight loss This list may not describe all possible side effects. Call your doctor for medical advice about side effects. You may report side effects to FDA at 1-800-FDA-1088. Where should I keep my medicine? Keep out of the reach of children. This medicine can be abused. Keep your medicine in a safe place to protect it from theft. Do not share this medicine with anyone. Selling or giving away this medicine is dangerous and against the law. Store at room temperature between 15 and 30 degrees C (59 and 86 degrees F). Protect from light and moisture. Keep container tightly closed. Throw away any unused medicine after the expiration date. NOTE: This sheet is a summary. It may not cover all possible information. If you have questions about this medicine, talk to your doctor, pharmacist, or health care provider.  2015, [employer]vier/Gold Standard. (2013-03-01 10:09:08)  

## 2014-07-07 DIAGNOSIS — I10 Essential (primary) hypertension: Secondary | ICD-10-CM | POA: Diagnosis not present

## 2014-07-07 DIAGNOSIS — M47816 Spondylosis without myelopathy or radiculopathy, lumbar region: Secondary | ICD-10-CM | POA: Diagnosis not present

## 2014-07-07 DIAGNOSIS — Z6832 Body mass index (BMI) 32.0-32.9, adult: Secondary | ICD-10-CM | POA: Diagnosis not present

## 2014-07-08 ENCOUNTER — Other Ambulatory Visit: Payer: Self-pay | Admitting: Neurological Surgery

## 2014-07-08 DIAGNOSIS — M47816 Spondylosis without myelopathy or radiculopathy, lumbar region: Secondary | ICD-10-CM

## 2014-07-21 ENCOUNTER — Ambulatory Visit
Admission: RE | Admit: 2014-07-21 | Discharge: 2014-07-21 | Disposition: A | Discharge status: Home / Self Care | Payer: Medicare Other | Source: Ambulatory Visit | Attending: Neurological Surgery | Admitting: Neurological Surgery

## 2014-07-21 DIAGNOSIS — M47816 Spondylosis without myelopathy or radiculopathy, lumbar region: Secondary | ICD-10-CM | POA: Diagnosis not present

## 2014-07-21 DIAGNOSIS — M4326 Fusion of spine, lumbar region: Secondary | ICD-10-CM | POA: Diagnosis not present

## 2014-07-21 DIAGNOSIS — S22080A Wedge compression fracture of T11-T12 vertebra, initial encounter for closed fracture: Secondary | ICD-10-CM | POA: Diagnosis not present

## 2014-07-21 MED ORDER — GADOBENATE DIMEGLUMINE 529 MG/ML IV SOLN
20.0000 mL | Freq: Once | INTRAVENOUS | Status: AC | PRN
  Administered 2014-07-21: 20 mL via INTRAVENOUS

## 2014-07-27 DIAGNOSIS — M47816 Spondylosis without myelopathy or radiculopathy, lumbar region: Secondary | ICD-10-CM | POA: Diagnosis not present

## 2014-07-27 DIAGNOSIS — Z6831 Body mass index (BMI) 31.0-31.9, adult: Secondary | ICD-10-CM | POA: Diagnosis not present

## 2014-08-11 DIAGNOSIS — M47816 Spondylosis without myelopathy or radiculopathy, lumbar region: Secondary | ICD-10-CM | POA: Diagnosis not present

## 2014-10-05 DIAGNOSIS — M94262 Chondromalacia, left knee: Secondary | ICD-10-CM | POA: Diagnosis not present

## 2014-10-05 DIAGNOSIS — Y929 Unspecified place or not applicable: Secondary | ICD-10-CM | POA: Diagnosis not present

## 2014-10-05 DIAGNOSIS — S83232A Complex tear of medial meniscus, current injury, left knee, initial encounter: Secondary | ICD-10-CM | POA: Diagnosis not present

## 2014-10-05 DIAGNOSIS — X58XXXA Exposure to other specified factors, initial encounter: Secondary | ICD-10-CM | POA: Diagnosis not present

## 2014-10-05 DIAGNOSIS — M6752 Plica syndrome, left knee: Secondary | ICD-10-CM | POA: Diagnosis not present

## 2014-10-13 DIAGNOSIS — Z9889 Other specified postprocedural states: Secondary | ICD-10-CM | POA: Diagnosis not present

## 2014-10-13 DIAGNOSIS — M25662 Stiffness of left knee, not elsewhere classified: Secondary | ICD-10-CM | POA: Diagnosis not present

## 2014-10-17 DIAGNOSIS — Z9889 Other specified postprocedural states: Secondary | ICD-10-CM | POA: Diagnosis not present

## 2014-10-17 DIAGNOSIS — M25662 Stiffness of left knee, not elsewhere classified: Secondary | ICD-10-CM | POA: Diagnosis not present

## 2014-10-20 DIAGNOSIS — M25662 Stiffness of left knee, not elsewhere classified: Secondary | ICD-10-CM | POA: Diagnosis not present

## 2014-10-20 DIAGNOSIS — Z9889 Other specified postprocedural states: Secondary | ICD-10-CM | POA: Diagnosis not present

## 2014-10-24 DIAGNOSIS — Z9889 Other specified postprocedural states: Secondary | ICD-10-CM | POA: Diagnosis not present

## 2014-10-24 DIAGNOSIS — M25662 Stiffness of left knee, not elsewhere classified: Secondary | ICD-10-CM | POA: Diagnosis not present

## 2014-12-08 ENCOUNTER — Other Ambulatory Visit: Payer: Self-pay | Admitting: Neurology

## 2014-12-08 ENCOUNTER — Telehealth: Payer: Self-pay

## 2014-12-08 DIAGNOSIS — F900 Attention-deficit hyperactivity disorder, predominantly inattentive type: Secondary | ICD-10-CM

## 2014-12-08 MED ORDER — METHYLPHENIDATE HCL 10 MG PO TABS: 10.0000 mg | ORAL_TABLET | Freq: Every day | ORAL | Status: DC

## 2014-12-08 NOTE — Telephone Encounter (Signed)
Called pt and informed him that his RX is ready for pick up at the front desk. Pt verbalized understanding.

## 2014-12-08 NOTE — Telephone Encounter (Signed)
Request entered, forwarded to provider for approval.  

## 2014-12-08 NOTE — Telephone Encounter (Signed)
Patient requested refill for Rx. methylphenidate (RITALIN) 10 MG tablet.

## 2014-12-31 DIAGNOSIS — K088 Other specified disorders of teeth and supporting structures: Secondary | ICD-10-CM | POA: Diagnosis not present

## 2015-03-01 DIAGNOSIS — I10 Essential (primary) hypertension: Secondary | ICD-10-CM | POA: Diagnosis not present

## 2015-03-01 DIAGNOSIS — Z0000 Encounter for general adult medical examination without abnormal findings: Secondary | ICD-10-CM | POA: Diagnosis not present

## 2015-03-01 DIAGNOSIS — J309 Allergic rhinitis, unspecified: Secondary | ICD-10-CM | POA: Diagnosis not present

## 2015-03-01 DIAGNOSIS — E559 Vitamin D deficiency, unspecified: Secondary | ICD-10-CM | POA: Diagnosis not present

## 2015-03-01 DIAGNOSIS — M47816 Spondylosis without myelopathy or radiculopathy, lumbar region: Secondary | ICD-10-CM | POA: Diagnosis not present

## 2015-03-01 DIAGNOSIS — E782 Mixed hyperlipidemia: Secondary | ICD-10-CM | POA: Diagnosis not present

## 2015-03-01 DIAGNOSIS — F329 Major depressive disorder, single episode, unspecified: Secondary | ICD-10-CM | POA: Diagnosis not present

## 2015-03-01 DIAGNOSIS — Z125 Encounter for screening for malignant neoplasm of prostate: Secondary | ICD-10-CM | POA: Diagnosis not present

## 2015-03-01 DIAGNOSIS — Z23 Encounter for immunization: Secondary | ICD-10-CM | POA: Diagnosis not present

## 2015-03-01 DIAGNOSIS — F900 Attention-deficit hyperactivity disorder, predominantly inattentive type: Secondary | ICD-10-CM | POA: Diagnosis not present

## 2015-03-01 DIAGNOSIS — H6120 Impacted cerumen, unspecified ear: Secondary | ICD-10-CM | POA: Diagnosis not present

## 2015-03-01 DIAGNOSIS — R7301 Impaired fasting glucose: Secondary | ICD-10-CM | POA: Diagnosis not present

## 2015-03-06 ENCOUNTER — Telehealth: Payer: Self-pay | Admitting: Neurology

## 2015-03-06 NOTE — Telephone Encounter (Signed)
Patient called to advise that his PCP-Dr. Catha Gosselin wants him to talk to Dr. Vickey Huger about taking him off of methylphenidate (RITALIN) 10 MG tablet, would like for Dr. Vickey Huger to prescribe something different because Ritalin can cause a heart attack in men his age.

## 2015-03-06 NOTE — Telephone Encounter (Signed)
Called pt to discuss ritalin. Spoke to wife (per DPR). I offered pt and wife an appt on 9/15 at 11:00. Pt's wife accepted this appt to discuss other treatments, other than the ritalin. I advised pt's wife to arrive 15 minutes early, pt's wife verbalized understanding.

## 2015-03-09 ENCOUNTER — Ambulatory Visit: Payer: Self-pay | Admitting: Neurology

## 2015-03-09 ENCOUNTER — Telehealth: Payer: Self-pay

## 2015-03-09 NOTE — Telephone Encounter (Signed)
Called pt to advise him that we needed to r/s his appt today with Dr. Vickey Huger since she is out of the office. I left a message on pt's home number (per DPR) asking pt to please call me back to r/s appt.

## 2015-03-09 NOTE — Telephone Encounter (Signed)
Spoke to pts wife (per DPR) and advised her of Dr. Vickey Huger not being in the office today. Pt's wife said that pt will call back later to get the appt rescheduled.

## 2015-03-17 DIAGNOSIS — M545 Low back pain: Secondary | ICD-10-CM | POA: Diagnosis not present

## 2015-04-03 ENCOUNTER — Other Ambulatory Visit: Payer: Self-pay | Admitting: Neurology

## 2015-04-03 DIAGNOSIS — F900 Attention-deficit hyperactivity disorder, predominantly inattentive type: Secondary | ICD-10-CM

## 2015-04-03 MED ORDER — METHYLPHENIDATE HCL 10 MG PO TABS: 10.0000 mg | ORAL_TABLET | Freq: Every day | ORAL | Status: DC

## 2015-04-03 NOTE — Telephone Encounter (Signed)
Patient is calling to order written Rx Methylphenidate 10 mg.  Thanks!

## 2015-04-03 NOTE — Telephone Encounter (Signed)
Request entered, forwarded to provider for approval.  

## 2015-04-17 ENCOUNTER — Ambulatory Visit: Payer: Medicare Other | Admitting: Neurology

## 2015-06-07 DIAGNOSIS — R319 Hematuria, unspecified: Secondary | ICD-10-CM | POA: Diagnosis not present

## 2015-06-07 DIAGNOSIS — R300 Dysuria: Secondary | ICD-10-CM | POA: Diagnosis not present

## 2015-06-22 DIAGNOSIS — R0683 Snoring: Secondary | ICD-10-CM | POA: Diagnosis not present

## 2015-06-22 DIAGNOSIS — J309 Allergic rhinitis, unspecified: Secondary | ICD-10-CM | POA: Diagnosis not present

## 2015-06-22 DIAGNOSIS — R319 Hematuria, unspecified: Secondary | ICD-10-CM | POA: Diagnosis not present

## 2015-06-27 ENCOUNTER — Telehealth: Payer: Self-pay

## 2015-06-27 ENCOUNTER — Other Ambulatory Visit: Payer: Self-pay | Admitting: Neurology

## 2015-06-27 DIAGNOSIS — F900 Attention-deficit hyperactivity disorder, predominantly inattentive type: Secondary | ICD-10-CM

## 2015-06-27 MED ORDER — METHYLPHENIDATE HCL 10 MG PO TABS: 10.0000 mg | ORAL_TABLET | Freq: Every day | ORAL | Status: DC

## 2015-06-27 NOTE — Telephone Encounter (Signed)
Pt needs refill on methylphenidate (RITALIN) 10 MG tablet. Thank you °

## 2015-06-27 NOTE — Telephone Encounter (Signed)
Called pt to inform her that his RX is ready for pick up, no answer, left a message at [phone] per DPR advising him that his RX is ready for pick up.

## 2015-06-30 ENCOUNTER — Ambulatory Visit: Payer: Medicare Other | Admitting: Adult Health

## 2015-07-03 ENCOUNTER — Ambulatory Visit: Payer: Medicare Other | Admitting: Adult Health

## 2015-07-07 ENCOUNTER — Encounter: Payer: Self-pay | Admitting: Adult Health

## 2015-07-07 ENCOUNTER — Ambulatory Visit (INDEPENDENT_AMBULATORY_CARE_PROVIDER_SITE_OTHER): Payer: Medicare Other | Admitting: Adult Health

## 2015-07-07 VITALS — BP 115/81 | HR 94 | Ht 72.0 in | Wt 246.0 lb

## 2015-07-07 DIAGNOSIS — F900 Attention-deficit hyperactivity disorder, predominantly inattentive type: Secondary | ICD-10-CM | POA: Diagnosis not present

## 2015-07-07 NOTE — Progress Notes (Signed)
PATIENT: Stephen York DOB: [birth year]  REASON FOR VISIT: follow up-  ADD HISTORY FROM: patient  HISTORY OF PRESENT ILLNESS: Stephen York  is a 65 year old male with a history of ADD. He returns today for follow-up. The patient continues to take Ritalin and tolerates it well. He states that his primary care has suggested that he come off the medication due to his blood pressure. The patient states that he did come off the medication  But they did not find a better replacement and therefore he restarted it. Patient states that he monitors his blood pressure at home. He states that his blood pressure remains in normal range. He denies any chest pain. He states that he does have some discomfort that is associated with his hiatal hernia. Patient denies any new neurological symptoms. He returns today for evaluation.  HISTORY   07-01-13; Stephen York returns for yearly follow up for ADD. He has no complaints. Need refills . Has HTN , but no chest pain., headaches, No diaphoresis , no diarrhea.   04/17/12: Stephen York is a 65 year old male here for his yearly follow up for ADD. He reports his concentration has been better with his Ritalin.   PRIOR HPI (Dr. Vickey Huger): He has a history of hearing loss and ringing in the ear, he has chronic sinus problems. He sees a dermatologist for his eczema and rash on his face. He also has had three back surgeries in the past and was found six to eight months ago to have another ruptured disc, however, he is being treated conservatively by Dr. Danielle Dess. He has a history of high blood pressure, high cholesterol and posttraumatic stress disorder.  REVIEW OF SYSTEMS: Out of a complete 14 system review of symptoms, the patient complains only of the following symptoms, and all other reviewed systems are negative.   cough, snoring, back pain, blood in urine , rash, dizziness  ALLERGIES: Allergies  Allergen Reactions  . Lipitor [Atorvastatin]     HOME  MEDICATIONS: Outpatient Prescriptions Prior to Visit  Medication Sig Dispense Refill  . amLODipine (NORVASC) 10 MG tablet Take 10 mg by mouth daily.    Marland Kitchen aspirin 81 MG chewable tablet Chew 81 mg by mouth daily.    Marland Kitchen atorvastatin (LIPITOR) 10 MG tablet Take 10 mg by mouth daily.    . cholecalciferol (VITAMIN D) 1000 UNITS tablet Take 2,000 Units by mouth daily.     Marland Kitchen co-enzyme Q-10 50 MG capsule Take 100 mg by mouth daily.    Marland Kitchen docusate sodium (COLACE) 100 MG capsule Take 100 mg by mouth daily.    . fenofibrate 160 MG tablet Take 160 mg by mouth daily.    . hydrochlorothiazide (HYDRODIURIL) 25 MG tablet Take 25 mg by mouth daily.    Marland Kitchen lisinopril (PRINIVIL,ZESTRIL) 10 MG tablet Take 10 mg by mouth daily. Take half tablet daily.    . meloxicam (MOBIC) 15 MG tablet Take 15 mg by mouth daily.    . methocarbamol (ROBAXIN) 500 MG tablet Take 500 mg by mouth 2 (two) times daily.     . methylphenidate (RITALIN) 10 MG tablet Take 1 tablet (10 mg total) by mouth daily. 90 tablet 0  . Omega-3 Fatty Acids (FISH OIL PO) Take 1,000 mg by mouth daily.    . ranitidine (ZANTAC) 150 MG tablet Take 150 mg by mouth 2 (two) times daily.    . sertraline (ZOLOFT) 100 MG tablet     . tiZANidine (ZANAFLEX) 4 MG  tablet Take 4 mg by mouth daily.    . vitamin E 1000 UNIT capsule Take 400 Units by mouth daily.     Marland Kitchen. HYDROcodone-acetaminophen (NORCO/VICODIN) 5-325 MG per tablet Take 1-2 tablets by mouth every 6 (six) hours as needed for moderate pain.     No facility-administered medications prior to visit.    PAST MEDICAL HISTORY: Past Medical History  Diagnosis Date  . Kidney stones october 2012    passed it  . Hyperlipidemia   . PTSD (post-traumatic stress disorder)   . Obesity   . Dizziness   . Degenerative disk disease   . Chronic sinusitis   . ADD (attention deficit hyperactivity disorder, inattentive type)     e  . Knee problem     left knee    PAST SURGICAL HISTORY: Past Surgical History    Procedure Laterality Date  . Back surgeries      3    FAMILY HISTORY: History reviewed. No pertinent family history.  SOCIAL HISTORY: Social History   Social History  . Marital Status: Married    Spouse Name: Samara Deistkathryn  . Number of Children: 2  . Years of Education: 12   Occupational History  . retired    Social History Main Topics  . Smoking status: Former Smoker    Quit date: 09/29/1992  . Smokeless tobacco: Not on file  . Alcohol Use: No     Comment: quit in 1981  . Drug Use: No  . Sexual Activity: No   Other Topics Concern  . Not on file   Social History Narrative   Caffeine 3 cups in am, Married, 2 kids. Retired.        PHYSICAL EXAM  Filed Vitals:   07/07/15 1302  BP: 115/81  Pulse: 94  Height: 6' (1.829 m)  Weight: 246 lb (111.585 kg)   Body mass index is 33.36 kg/(m^2).  Generalized: Well developed, in no acute distress   Neurological examination  Mentation: Alert oriented to time, place, history taking. Follows all commands speech and language fluent Cranial nerve II-XII: Pupils were equal round reactive to light. Extraocular movements were full, visual field were full on confrontational test. Facial sensation and strength were normal. Uvula tongue midline. Head turning and shoulder shrug  were normal and symmetric. Motor: The motor testing reveals 5 over 5 strength of all 4 extremities. Good symmetric motor tone is noted throughout.  Sensory: Sensory testing is intact to soft touch on all 4 extremities. No evidence of extinction is noted.  Coordination: Cerebellar testing reveals good finger-nose-finger and heel-to-shin bilaterally.  Gait and station: Gait is normal. Tandem gait is normal. Romberg is negative. No drift is seen.  Reflexes: Deep tendon reflexes are symmetric and normal bilaterally.   DIAGNOSTIC DATA (LABS, IMAGING, TESTING) - I reviewed patient records, labs, notes, testing and imaging myself where available.     ASSESSMENT  AND PLAN 65 y.o. year old male  has a past medical history of Kidney stones (october 2012); Hyperlipidemia; PTSD (post-traumatic stress disorder); Obesity; Dizziness; Degenerative disk disease; Chronic sinusitis; ADD (attention deficit hyperactivity disorder, inattentive type); and Knee problem. here with:   1.  ADD   Overall the patient is doing well. He will continue on Ritalin. He does not need a new prescription today. His blood pressure and heart rate are in normal range. He will follow-up in one year or sooner if needed.     Stephen PennyMegan Lokelani Lutes, Stephen York, Stephen York 07/07/2015, 1:16 PM [facility] [address] [place], [place] [address] [phone]

## 2015-07-07 NOTE — Progress Notes (Signed)
I have read the note, and I agree with the clinical assessment and plan.  Lorenza Shakir KEITH   

## 2015-07-07 NOTE — Patient Instructions (Signed)
Continue ritalin  If blood pressure increases we will let us know- ritalin may need to be reconsidered.  If your symptoms worsen or you develop new symptoms please let us know.

## 2015-07-11 DIAGNOSIS — R319 Hematuria, unspecified: Secondary | ICD-10-CM | POA: Diagnosis not present

## 2015-07-11 DIAGNOSIS — R300 Dysuria: Secondary | ICD-10-CM | POA: Diagnosis not present

## 2015-07-13 DIAGNOSIS — M659 Synovitis and tenosynovitis, unspecified: Secondary | ICD-10-CM | POA: Diagnosis not present

## 2015-07-13 DIAGNOSIS — M1990 Unspecified osteoarthritis, unspecified site: Secondary | ICD-10-CM | POA: Diagnosis not present

## 2015-07-13 DIAGNOSIS — M109 Gout, unspecified: Secondary | ICD-10-CM | POA: Diagnosis not present

## 2015-07-13 DIAGNOSIS — M7752 Other enthesopathy of left foot: Secondary | ICD-10-CM | POA: Diagnosis not present

## 2015-07-17 DIAGNOSIS — M109 Gout, unspecified: Secondary | ICD-10-CM | POA: Diagnosis not present

## 2015-07-17 DIAGNOSIS — M7752 Other enthesopathy of left foot: Secondary | ICD-10-CM | POA: Diagnosis not present

## 2015-07-27 DIAGNOSIS — M10072 Idiopathic gout, left ankle and foot: Secondary | ICD-10-CM | POA: Diagnosis not present

## 2015-07-27 DIAGNOSIS — I10 Essential (primary) hypertension: Secondary | ICD-10-CM | POA: Diagnosis not present

## 2015-09-25 DIAGNOSIS — R7301 Impaired fasting glucose: Secondary | ICD-10-CM | POA: Diagnosis not present

## 2015-09-25 DIAGNOSIS — M47816 Spondylosis without myelopathy or radiculopathy, lumbar region: Secondary | ICD-10-CM | POA: Diagnosis not present

## 2015-09-25 DIAGNOSIS — E559 Vitamin D deficiency, unspecified: Secondary | ICD-10-CM | POA: Diagnosis not present

## 2015-09-25 DIAGNOSIS — E782 Mixed hyperlipidemia: Secondary | ICD-10-CM | POA: Diagnosis not present

## 2015-09-25 DIAGNOSIS — I10 Essential (primary) hypertension: Secondary | ICD-10-CM | POA: Diagnosis not present

## 2015-09-25 DIAGNOSIS — R319 Hematuria, unspecified: Secondary | ICD-10-CM | POA: Diagnosis not present

## 2015-09-25 DIAGNOSIS — F329 Major depressive disorder, single episode, unspecified: Secondary | ICD-10-CM | POA: Diagnosis not present

## 2015-09-25 DIAGNOSIS — Z0000 Encounter for general adult medical examination without abnormal findings: Secondary | ICD-10-CM | POA: Diagnosis not present

## 2015-09-25 DIAGNOSIS — Z125 Encounter for screening for malignant neoplasm of prostate: Secondary | ICD-10-CM | POA: Diagnosis not present

## 2015-09-25 DIAGNOSIS — R300 Dysuria: Secondary | ICD-10-CM | POA: Diagnosis not present

## 2015-09-25 DIAGNOSIS — M109 Gout, unspecified: Secondary | ICD-10-CM | POA: Diagnosis not present

## 2015-09-26 ENCOUNTER — Telehealth: Payer: Self-pay | Admitting: Adult Health

## 2015-09-26 DIAGNOSIS — F900 Attention-deficit hyperactivity disorder, predominantly inattentive type: Secondary | ICD-10-CM

## 2015-09-26 NOTE — Telephone Encounter (Signed)
Ok to fill 

## 2015-09-26 NOTE — Telephone Encounter (Signed)
Pt needs refill for methylphenidate (RITALIN) 10 MG tablet . He will be out on Thursday

## 2015-09-26 NOTE — Telephone Encounter (Signed)
Ok to refill. I can only do a 30 day supply unless Dr. Vickey Hugerohmeier wants to refill.

## 2015-09-27 MED ORDER — METHYLPHENIDATE HCL 10 MG PO TABS: 10.0000 mg | ORAL_TABLET | Freq: Every day | ORAL | Status: DC

## 2015-09-27 NOTE — Telephone Encounter (Signed)
Prescription printed and given to RN- Lafonda MossesDiana.

## 2015-09-27 NOTE — Telephone Encounter (Signed)
Patient called to check status of Rx refill request for RITALIN.

## 2015-09-27 NOTE — Telephone Encounter (Signed)
I called patient and let him know his Rx is ready at front desk for pick up.

## 2015-09-27 NOTE — Telephone Encounter (Signed)
Can you print ?

## 2015-10-14 DIAGNOSIS — L237 Allergic contact dermatitis due to plants, except food: Secondary | ICD-10-CM | POA: Diagnosis not present

## 2015-10-16 DIAGNOSIS — R310 Gross hematuria: Secondary | ICD-10-CM | POA: Diagnosis not present

## 2015-10-16 DIAGNOSIS — Z0000 Encounter for general adult medical examination without abnormal findings: Secondary | ICD-10-CM | POA: Diagnosis not present

## 2015-10-16 DIAGNOSIS — Z125 Encounter for screening for malignant neoplasm of prostate: Secondary | ICD-10-CM | POA: Diagnosis not present

## 2015-10-16 DIAGNOSIS — N200 Calculus of kidney: Secondary | ICD-10-CM | POA: Diagnosis not present

## 2015-10-18 ENCOUNTER — Encounter: Payer: Self-pay | Admitting: Gastroenterology

## 2015-10-24 ENCOUNTER — Telehealth: Payer: Self-pay | Admitting: Neurology

## 2015-10-24 DIAGNOSIS — F900 Attention-deficit hyperactivity disorder, predominantly inattentive type: Secondary | ICD-10-CM

## 2015-10-24 DIAGNOSIS — R310 Gross hematuria: Secondary | ICD-10-CM | POA: Diagnosis not present

## 2015-10-24 MED ORDER — METHYLPHENIDATE HCL 10 MG PO TABS: 10.0000 mg | ORAL_TABLET | Freq: Every day | ORAL | Status: DC

## 2015-10-24 NOTE — Telephone Encounter (Addendum)
Pt returned Kristen's call, message relayed, pt understood

## 2015-10-24 NOTE — Telephone Encounter (Signed)
Called to advise pt that his RX for ritalin is available at the front desk for pick up. No answer, left a message asking him to call back. If pt calls back, please advise him of this information and of the clinic hours.

## 2015-10-24 NOTE — Telephone Encounter (Signed)
Patient called to request refill of methylphenidate (RITALIN) 10 MG tablet °

## 2015-10-30 DIAGNOSIS — N200 Calculus of kidney: Secondary | ICD-10-CM | POA: Diagnosis not present

## 2015-10-30 DIAGNOSIS — Z0000 Encounter for general adult medical examination without abnormal findings: Secondary | ICD-10-CM | POA: Diagnosis not present

## 2015-10-30 DIAGNOSIS — R310 Gross hematuria: Secondary | ICD-10-CM | POA: Diagnosis not present

## 2015-11-21 ENCOUNTER — Telehealth: Payer: Self-pay | Admitting: Adult Health

## 2015-11-21 ENCOUNTER — Telehealth: Payer: Self-pay

## 2015-11-21 DIAGNOSIS — F900 Attention-deficit hyperactivity disorder, predominantly inattentive type: Secondary | ICD-10-CM

## 2015-11-21 MED ORDER — METHYLPHENIDATE HCL 10 MG PO TABS: 10.0000 mg | ORAL_TABLET | Freq: Every day | ORAL | Status: DC

## 2015-11-21 NOTE — Telephone Encounter (Signed)
I advised patient that his ritalin rx is at front desk ready for p/u.

## 2015-11-21 NOTE — Telephone Encounter (Signed)
Patient requesting refill of methylphenidate (RITALIN) 10 MG tablet. ° ° °

## 2015-11-21 NOTE — Telephone Encounter (Signed)
Prescription printed. Given to nurse Lafonda Mossesiana

## 2015-12-25 ENCOUNTER — Telehealth: Payer: Self-pay | Admitting: Neurology

## 2015-12-25 DIAGNOSIS — F900 Attention-deficit hyperactivity disorder, predominantly inattentive type: Secondary | ICD-10-CM

## 2015-12-25 MED ORDER — METHYLPHENIDATE HCL 10 MG PO TABS: 10.0000 mg | ORAL_TABLET | Freq: Every day | ORAL | Status: DC

## 2015-12-25 NOTE — Telephone Encounter (Signed)
Awaiting signature from VP, MD

## 2015-12-25 NOTE — Telephone Encounter (Signed)
Rx due and up to date on appts.

## 2015-12-25 NOTE — Telephone Encounter (Signed)
Patient said he needed a refill on Ritalin 10 MG .Marland Kitchen. He said he would come back on Wednesday to pick it up. The best number to contact patient is [phone]

## 2015-12-25 NOTE — Telephone Encounter (Signed)
Placed signed rx up front for pt pick up.

## 2015-12-25 NOTE — Telephone Encounter (Signed)
rx ordered for ritalin. -VRP

## 2016-01-22 ENCOUNTER — Other Ambulatory Visit: Payer: Self-pay | Admitting: Neurology

## 2016-01-22 DIAGNOSIS — F900 Attention-deficit hyperactivity disorder, predominantly inattentive type: Secondary | ICD-10-CM

## 2016-01-22 MED ORDER — METHYLPHENIDATE HCL 10 MG PO TABS: 10.0000 mg | ORAL_TABLET | Freq: Every day | ORAL | 0 refills | Status: DC

## 2016-01-22 NOTE — Telephone Encounter (Signed)
Patient called to request refill of methylphenidate (RITALIN) 10 MG tablet °

## 2016-01-22 NOTE — Telephone Encounter (Signed)
I spoke to pt and advised him that his RX for ritalin is available for pick up at the front desk.

## 2016-02-21 ENCOUNTER — Other Ambulatory Visit: Payer: Self-pay | Admitting: Neurology

## 2016-02-21 DIAGNOSIS — F900 Attention-deficit hyperactivity disorder, predominantly inattentive type: Secondary | ICD-10-CM

## 2016-02-21 MED ORDER — METHYLPHENIDATE HCL 10 MG PO TABS: 10.0000 mg | ORAL_TABLET | Freq: Every day | ORAL | 0 refills | Status: DC

## 2016-02-21 NOTE — Addendum Note (Signed)
Addended by: Geronimo RunningINKINS, Norlan Rann A on: 02/21/2016 09:10 AM   Modules accepted: Orders

## 2016-02-21 NOTE — Telephone Encounter (Signed)
Patient requesting refill of  methylphenidate (RITALIN) 10 MG tablet. I advised the Rx will be ready in 24 hours unless the nurse advises otherwise. ° ° °

## 2016-02-22 NOTE — Telephone Encounter (Signed)
I spoke to [place], pt's wife, per DPR, and advised her that pt's RX is ready for pick up at the front desk. Pt's wife verbalized understanding.

## 2016-03-07 DIAGNOSIS — F338 Other recurrent depressive disorders: Secondary | ICD-10-CM | POA: Diagnosis not present

## 2016-03-07 DIAGNOSIS — R7301 Impaired fasting glucose: Secondary | ICD-10-CM | POA: Diagnosis not present

## 2016-03-07 DIAGNOSIS — Z8739 Personal history of other diseases of the musculoskeletal system and connective tissue: Secondary | ICD-10-CM | POA: Diagnosis not present

## 2016-03-07 DIAGNOSIS — I10 Essential (primary) hypertension: Secondary | ICD-10-CM | POA: Diagnosis not present

## 2016-03-07 DIAGNOSIS — Z23 Encounter for immunization: Secondary | ICD-10-CM | POA: Diagnosis not present

## 2016-03-07 DIAGNOSIS — E782 Mixed hyperlipidemia: Secondary | ICD-10-CM | POA: Diagnosis not present

## 2016-03-07 DIAGNOSIS — E559 Vitamin D deficiency, unspecified: Secondary | ICD-10-CM | POA: Diagnosis not present

## 2016-03-07 DIAGNOSIS — L219 Seborrheic dermatitis, unspecified: Secondary | ICD-10-CM | POA: Diagnosis not present

## 2016-03-07 DIAGNOSIS — J309 Allergic rhinitis, unspecified: Secondary | ICD-10-CM | POA: Diagnosis not present

## 2016-03-07 DIAGNOSIS — Z0000 Encounter for general adult medical examination without abnormal findings: Secondary | ICD-10-CM | POA: Diagnosis not present

## 2016-03-07 DIAGNOSIS — M47816 Spondylosis without myelopathy or radiculopathy, lumbar region: Secondary | ICD-10-CM | POA: Diagnosis not present

## 2016-03-07 DIAGNOSIS — F900 Attention-deficit hyperactivity disorder, predominantly inattentive type: Secondary | ICD-10-CM | POA: Diagnosis not present

## 2016-03-15 DIAGNOSIS — M545 Low back pain: Secondary | ICD-10-CM | POA: Diagnosis not present

## 2016-03-25 ENCOUNTER — Other Ambulatory Visit: Payer: Self-pay | Admitting: Neurology

## 2016-03-25 MED ORDER — METHYLPHENIDATE HCL 10 MG PO TABS: 10.0000 mg | ORAL_TABLET | Freq: Every day | ORAL | 0 refills | Status: DC

## 2016-03-25 NOTE — Telephone Encounter (Signed)
I spoke to pt and advised him that his ritalin RX is ready for pick up at the front desk. Pt verbalized understanding.

## 2016-03-25 NOTE — Telephone Encounter (Signed)
Pt request refill for methylphenidate (RITALIN) 10 MG tablet °

## 2016-04-11 DIAGNOSIS — K0889 Other specified disorders of teeth and supporting structures: Secondary | ICD-10-CM | POA: Diagnosis not present

## 2016-04-19 ENCOUNTER — Other Ambulatory Visit: Payer: Self-pay | Admitting: Neurology

## 2016-04-19 NOTE — Telephone Encounter (Signed)
Patient requesting refill of methylphenidate (RITALIN) 10 MG tablet  °Pharmacy: pick up ° °

## 2016-04-22 MED ORDER — METHYLPHENIDATE HCL 10 MG PO TABS: 10.0000 mg | ORAL_TABLET | Freq: Every day | ORAL | 0 refills | Status: DC

## 2016-04-22 NOTE — Telephone Encounter (Signed)
I called pt to advise him that his ritalin RX is ready for pick up at the front desk. Pt's phone just keeps ringing, unable to leave a message. If pt calls back, please advise him of this information.

## 2016-05-20 ENCOUNTER — Other Ambulatory Visit: Payer: Self-pay | Admitting: Neurology

## 2016-05-20 MED ORDER — METHYLPHENIDATE HCL 10 MG PO TABS: 10.0000 mg | ORAL_TABLET | Freq: Every day | ORAL | 0 refills | Status: DC

## 2016-05-20 NOTE — Telephone Encounter (Signed)
Pt request refill for methylphenidate (RITALIN) 10 MG tablet °

## 2016-05-20 NOTE — Telephone Encounter (Signed)
I called pt to advised him that his ritalin RX is available for pick up at the front desk. Pt verbalized understanding.

## 2016-06-13 ENCOUNTER — Other Ambulatory Visit: Payer: Self-pay | Admitting: Neurology

## 2016-06-13 NOTE — Telephone Encounter (Signed)
Pt called request refill for methylphenidate (RITALIN) 10 MG tablet . Pt said he will not pick it up until next Thursday or Friday. He aware the clinic closes at noon on Friday and is closed Monday and Tuesday.

## 2016-06-20 ENCOUNTER — Other Ambulatory Visit: Payer: Self-pay | Admitting: *Deleted

## 2016-06-20 MED ORDER — METHYLPHENIDATE HCL 10 MG PO TABS: 10.0000 mg | ORAL_TABLET | Freq: Every day | ORAL | 0 refills | Status: DC

## 2016-06-20 NOTE — Telephone Encounter (Signed)
Per Lafonda Mossesiana, RN pt picked up rx in office today.

## 2016-06-20 NOTE — Telephone Encounter (Signed)
Medication printed by [facility],RN.

## 2016-07-08 ENCOUNTER — Encounter: Payer: Self-pay | Admitting: Neurology

## 2016-07-08 ENCOUNTER — Ambulatory Visit (INDEPENDENT_AMBULATORY_CARE_PROVIDER_SITE_OTHER): Payer: Medicare Other | Admitting: Neurology

## 2016-07-08 VITALS — BP 120/72 | HR 108 | Resp 20 | Ht 72.0 in | Wt 239.0 lb

## 2016-07-08 DIAGNOSIS — F901 Attention-deficit hyperactivity disorder, predominantly hyperactive type: Secondary | ICD-10-CM

## 2016-07-08 MED ORDER — METHYLPHENIDATE HCL 10 MG PO TABS: 10.0000 mg | ORAL_TABLET | Freq: Every day | ORAL | 0 refills | Status: DC

## 2016-07-08 NOTE — Progress Notes (Signed)
PATIENT: Stephen RubensFrank P York DOB: 03/30/1951  REASON FOR VISIT: follow up-  ADD HISTORY FROM: patient  HISTORY OF PRESENT ILLNESS:  07-08-2016, Stephen York  is a 66 year old male with a history of ADD. He returns today for routine follow-up.  The patient continues to take Ritalin and tolerates it well. He states that his primary care has suggested that he come off the medication due to his blood pressure.  The patient states that he did come off the medication  But they did not find a better replacement and therefore he restarted it.  Patient states that he monitors his blood pressure at home. He states that his blood pressure remains in normal range. He denies any chest pain. He states that he does have some discomfort that is associated with his hiatal hernia. Patient denies any new neurological symptoms. BP and weight have been going down !  HISTORY   07-01-13; Stephen York returns for yearly follow up for ADD. He has no complaints. Need refills . Has HTN , but no chest pain., headaches, No diaphoresis , no diarrhea.   04/17/12: Stephen York is a 66 year old male here for his yearly follow up for ADD. He reports his concentration has been better with his Ritalin.   PRIOR HPI (Dr. Vickey Hugerohmeier): He has a history of hearing loss and ringing in the ear, he has chronic sinus problems. He sees a dermatologist for his eczema and rash on his face. He also has had three back surgeries in the past and was found six to eight months ago to have another ruptured disc, however, he is being treated conservatively by Dr. Danielle DessElsner. He has a history of high blood pressure, high cholesterol and posttraumatic stress disorder.  REVIEW OF SYSTEMS: Out of a complete 14 system review of symptoms, the patient complains only of the following symptoms, and all other reviewed systems are negative.   cough, snoring, back pain, blood in urine , rash, dizziness  ALLERGIES: Allergies  Allergen Reactions  . Lipitor  [Atorvastatin]     HOME MEDICATIONS: Outpatient Medications Prior to Visit  Medication Sig Dispense Refill  . ALPRAZolam (XANAX) 0.5 MG tablet Take 0.5 mg by mouth as needed.     Marland Kitchen. amLODipine (NORVASC) 10 MG tablet Take 10 mg by mouth daily.    Marland Kitchen. aspirin 81 MG chewable tablet Chew 81 mg by mouth daily.    Marland Kitchen. atorvastatin (LIPITOR) 10 MG tablet Take 10 mg by mouth daily.    . cholecalciferol (VITAMIN D) 1000 UNITS tablet Take 2,000 Units by mouth daily.     Marland Kitchen. co-enzyme Q-10 50 MG capsule Take 100 mg by mouth daily.    Marland Kitchen. docusate sodium (COLACE) 100 MG capsule Take 100 mg by mouth daily.    . fenofibrate 160 MG tablet Take 160 mg by mouth daily.    . hydrochlorothiazide (HYDRODIURIL) 25 MG tablet Take 25 mg by mouth daily.    Marland Kitchen. HYDROcodone-acetaminophen (NORCO) 7.5-325 MG tablet Take 2 tablets by mouth daily.     . meloxicam (MOBIC) 15 MG tablet Take 15 mg by mouth daily.    . methocarbamol (ROBAXIN) 500 MG tablet Take 500 mg by mouth 2 (two) times daily.     . methylphenidate (RITALIN) 10 MG tablet Take 1 tablet (10 mg total) by mouth daily. 30 tablet 0  . Omega-3 Fatty Acids (FISH OIL PO) Take 1,000 mg by mouth daily.    . ranitidine (ZANTAC) 150 MG tablet Take 150 mg by  mouth 2 (two) times daily.    . sertraline (ZOLOFT) 100 MG tablet     . tiZANidine (ZANAFLEX) 4 MG tablet Take 4 mg by mouth daily.    . vitamin E 1000 UNIT capsule Take 400 Units by mouth daily.     Marland Kitchen lisinopril (PRINIVIL,ZESTRIL) 10 MG tablet Take 10 mg by mouth daily. Take half tablet daily.     No facility-administered medications prior to visit.     PAST MEDICAL HISTORY: Past Medical History:  Diagnosis Date  . ADD (attention deficit hyperactivity disorder, inattentive type)    e  . Chronic sinusitis   . Degenerative disk disease   . Dizziness   . Hyperlipidemia   . Kidney stones october 2012   passed it  . Knee problem    left knee  . Obesity   . PTSD (post-traumatic stress disorder)     PAST  SURGICAL HISTORY: Past Surgical History:  Procedure Laterality Date  . back surgeries     3    FAMILY HISTORY: No family history on file.  SOCIAL HISTORY: Social History   Social History  . Marital status: Married    Spouse name: Samara Deist  . Number of children: 2  . Years of education: 12   Occupational History  . retired    Social History Main Topics  . Smoking status: Former Smoker    Quit date: 09/29/1992  . Smokeless tobacco: Not on file  . Alcohol use No     Comment: quit in 1981  . Drug use: No  . Sexual activity: No   Other Topics Concern  . Not on file   Social History Narrative   Caffeine 3 cups in am, Married, 2 kids. Retired.        PHYSICAL EXAM  Vitals:   07/08/16 1441  BP: 120/72  Pulse: (!) 108  Resp: 20  Weight: 239 lb (108.4 kg)  Height: 6' (1.829 m)   Body mass index is 32.41 kg/m.  Generalized: Well developed, in no acute distress   Neurological examination  Mentation: Alert oriented to time, place, history taking. Follows all commands speech and language fluent Cranial nerve ; Normal taste and smell report-  Pupils were equal round reactive to light. Extraocular movements were full, visual field were full on confrontational test. Facial sensation and strength were normal. Uvula tongue midline. Head turning and shoulder shrug  were normal and symmetric. Motor:  5 / 5 strength of all 4 extremities-  symmetric motor tone is noted throughout.  Sensory: intact to soft touch on all 4 extremities. No evidence of extinction is noted.  Coordination:  finger-nose-finger and heel-to-shin bilaterally intact.  Gait and station: Gait is normal.  He reports stumbling and having balance issues, had a fall, drifts to the right or left - stumbles more frequently. . Tandem gait is normal. Romberg is negative. No drift is seen.  Reflexes: Deep tendon reflexes are symmetric and normal bilaterally.   DIAGNOSTIC DATA (LABS, IMAGING, TESTING) - I reviewed  patient records, labs, notes, testing and imaging myself where available.   ASSESSMENT AND PLAN 66 y.o. year old male  has a past medical history of ADD (attention deficit hyperactivity disorder, inattentive type); Chronic sinusitis; Degenerative disk disease; Dizziness; Hyperlipidemia; Kidney stones (october 2012); Knee problem; Obesity; and PTSD (post-traumatic stress disorder). here with:   1.  ADD adult on ritalin.     Overall the patient is doing well. He will continue on Ritalin. He does not need a  new prescription today. His blood pressure and heart rate are in normal range. He will follow-up in one year or sooner if needed.   Jermari Tamargo, MD  07/08/2016, 2:55 PM [facility] [address], [address] [place], [place] [address] [phone]

## 2016-07-08 NOTE — Patient Instructions (Signed)
ADHD Management Plan (No Medication)  Goals:  What improvements would you most like to see?    Plans to reach these goals:   Further Evaluation   Resources and Treatment Strategies   Favorable outcomes in the treatment of ADHD involve ongoing and consistent caregiver communication with school, employed and family.  Call the clinic at with any further questions or concerns.

## 2016-07-25 ENCOUNTER — Ambulatory Visit: Payer: Medicare Other | Admitting: Neurology

## 2016-08-19 ENCOUNTER — Telehealth: Payer: Self-pay | Admitting: Neurology

## 2016-08-19 MED ORDER — METHYLPHENIDATE HCL 10 MG PO TABS: 10.0000 mg | ORAL_TABLET | Freq: Every day | ORAL | 0 refills | Status: DC

## 2016-08-19 NOTE — Addendum Note (Signed)
Addended by: Crisoforo OxfordURNER, DIANA S on: 08/19/2016 11:53 AM   Modules accepted: Orders

## 2016-08-19 NOTE — Telephone Encounter (Signed)
Patient requesting refill of methylphenidate (RITALIN) 10 MG tablet. Patients says he will pick up this Thursday or Friday.

## 2016-08-19 NOTE — Telephone Encounter (Signed)
Rx taken to front desk.

## 2016-08-19 NOTE — Telephone Encounter (Signed)
Rx printed, waiting for signature, I will put at front desk once it is signed.

## 2016-09-16 ENCOUNTER — Other Ambulatory Visit: Payer: Self-pay | Admitting: *Deleted

## 2016-09-16 MED ORDER — METHYLPHENIDATE HCL 10 MG PO TABS: 10.0000 mg | ORAL_TABLET | Freq: Every day | ORAL | 0 refills | Status: DC

## 2016-09-16 NOTE — Telephone Encounter (Signed)
I called pt. I advised him that his ritalin RX is ready for pick up at the front desk. Pt verbalized understanding.

## 2016-10-14 ENCOUNTER — Telehealth: Payer: Self-pay | Admitting: Neurology

## 2016-10-14 NOTE — Telephone Encounter (Signed)
Pt request refill for methylphenidate (RITALIN) 10 MG tablet . Pt said he will pick it up on Thursday

## 2016-10-15 MED ORDER — METHYLPHENIDATE HCL 10 MG PO TABS: 10.0000 mg | ORAL_TABLET | Freq: Every day | ORAL | 0 refills | Status: DC

## 2016-10-15 NOTE — Telephone Encounter (Signed)
Rx printed and put on Dr. Zannie Cove (on-call physician) desk for signature.

## 2016-10-15 NOTE — Addendum Note (Signed)
Addended by: Crisoforo Oxford on: 10/15/2016 08:43 AM   Modules accepted: Orders

## 2016-10-15 NOTE — Telephone Encounter (Signed)
Rx up at the front desk ready for pick up

## 2016-11-12 ENCOUNTER — Other Ambulatory Visit: Payer: Self-pay | Admitting: Neurology

## 2016-11-12 MED ORDER — METHYLPHENIDATE HCL 10 MG PO TABS: 10.0000 mg | ORAL_TABLET | Freq: Every day | ORAL | 0 refills | Status: DC

## 2016-11-12 NOTE — Telephone Encounter (Signed)
Pt calling for refill of methylphenidate (RITALIN) 10 MG tablet °

## 2016-11-12 NOTE — Addendum Note (Signed)
Addended by: Melvyn NovasHMEIER, Shigeko Manard on: 11/12/2016 04:08 PM   Modules accepted: Orders

## 2016-11-12 NOTE — Addendum Note (Signed)
Addended by: Geronimo RunningINKINS, Shakena Callari A on: 11/12/2016 03:06 PM   Modules accepted: Orders

## 2016-11-13 NOTE — Telephone Encounter (Signed)
I called pt, spoke to pt's wife, Natalia LeatherwoodKatherine, per DPR, and advised her that pt's RX is ready for pick up at the front dest. Pt's wife verbalized understanding.

## 2016-12-16 ENCOUNTER — Other Ambulatory Visit: Payer: Self-pay | Admitting: Neurology

## 2016-12-16 MED ORDER — METHYLPHENIDATE HCL 10 MG PO TABS: 10.0000 mg | ORAL_TABLET | Freq: Every day | ORAL | 0 refills | Status: DC

## 2016-12-16 NOTE — Addendum Note (Signed)
Addended by: Geronimo RunningINKINS, [place] A on: 12/16/2016 09:00 AM   Modules accepted: Orders

## 2016-12-16 NOTE — Telephone Encounter (Signed)
Rx signed and placed up front for pickup. 

## 2016-12-16 NOTE — Telephone Encounter (Signed)
Patient requesting refill of methylphenidate (RITALIN) 10 MG tablet. ° ° °

## 2016-12-18 DIAGNOSIS — R0781 Pleurodynia: Secondary | ICD-10-CM | POA: Diagnosis not present

## 2017-01-06 ENCOUNTER — Encounter: Payer: Self-pay | Admitting: Adult Health

## 2017-01-06 ENCOUNTER — Ambulatory Visit (INDEPENDENT_AMBULATORY_CARE_PROVIDER_SITE_OTHER): Payer: Medicare Other | Admitting: Adult Health

## 2017-01-06 ENCOUNTER — Encounter (INDEPENDENT_AMBULATORY_CARE_PROVIDER_SITE_OTHER): Payer: Self-pay

## 2017-01-06 VITALS — BP 125/78 | HR 93 | Wt 242.4 lb

## 2017-01-06 DIAGNOSIS — F988 Other specified behavioral and emotional disorders with onset usually occurring in childhood and adolescence: Secondary | ICD-10-CM | POA: Diagnosis not present

## 2017-01-06 NOTE — Progress Notes (Signed)
PATIENT: Stephen York DOB: September 14, 1950  REASON FOR VISIT: follow up- attention deficit disorder HISTORY FROM: patient  HISTORY OF PRESENT ILLNESS: Today 01/06/17 Stephen York is a 66 year old male with a history of attention deficit disorder. He returns today for follow-up. Overall he reports that he is doing quite well. He continues to take Ritalin and finds it very beneficial. He states sometimes in the day the effect will slowly wear off. He reports that he also checks his blood pressure. His blood pressure and heart rate remained normal range today. He is trying to diet and exercise. He denies any new neurological symptoms. He returns today for an evaluation.  HISTORY 07/08/16: Stephen York  is a 66 year old male with a history of ADD. He returns today for routine follow-up.  The patient continues to take Ritalin and tolerates it well. He states that his primary care has suggested that he come off the medication due to his blood pressure.  The patient states that he did come off the medication  But they did not find a better replacement and therefore he restarted it.  Patient states that he monitors his blood pressure at home. He states that his blood pressure remains in normal range. He denies any chest pain. He states that he does have some discomfort that is associated with his hiatal hernia. Patient denies any new neurological symptoms. BP and weight have been going down   REVIEW OF SYSTEMS: Out of a complete 14 system review of symptoms, the patient complains only of the following symptoms, and all other reviewed systems are negative.  Hearing loss, ringing in ears, wheezing, rectal pain, frequent waking, snoring, back pain, aching muscles, muscle cramps, neck pain, stiffness, itching, memory loss, dizziness, depression  ALLERGIES: Allergies  Allergen Reactions  . Lipitor [Atorvastatin]     HOME MEDICATIONS: Outpatient Medications Prior to Visit  Medication Sig Dispense  Refill  . ALPRAZolam (XANAX) 0.5 MG tablet Take 0.5 mg by mouth as needed.     Marland Kitchen amLODipine (NORVASC) 10 MG tablet Take 10 mg by mouth daily.    Marland Kitchen aspirin 81 MG chewable tablet Chew 81 mg by mouth daily.    Marland Kitchen atorvastatin (LIPITOR) 10 MG tablet Take 10 mg by mouth daily.    . cholecalciferol (VITAMIN D) 1000 UNITS tablet Take 2,000 Units by mouth daily.     Marland Kitchen co-enzyme Q-10 50 MG capsule Take 100 mg by mouth daily.    Marland Kitchen docusate sodium (COLACE) 100 MG capsule Take 100 mg by mouth daily.    . fenofibrate 160 MG tablet Take 160 mg by mouth daily.    . hydrochlorothiazide (HYDRODIURIL) 25 MG tablet Take 25 mg by mouth daily.    Marland Kitchen HYDROcodone-acetaminophen (NORCO) 7.5-325 MG tablet Take 2 tablets by mouth daily.     Marland Kitchen lisinopril (PRINIVIL,ZESTRIL) 40 MG tablet Take 40 mg by mouth daily.    . meloxicam (MOBIC) 15 MG tablet Take 15 mg by mouth daily.    . methocarbamol (ROBAXIN) 500 MG tablet Take 500 mg by mouth 2 (two) times daily.     . methylphenidate (RITALIN) 10 MG tablet Take 1 tablet (10 mg total) by mouth daily. 30 tablet 0  . Omega-3 Fatty Acids (FISH OIL PO) Take 1,000 mg by mouth daily.    . ranitidine (ZANTAC) 150 MG tablet Take 150 mg by mouth 2 (two) times daily.    . sertraline (ZOLOFT) 100 MG tablet     . tiZANidine (ZANAFLEX) 4 MG tablet  Take 4 mg by mouth daily.    . vitamin E 1000 UNIT capsule Take 400 Units by mouth daily.      No facility-administered medications prior to visit.     PAST MEDICAL HISTORY: Past Medical History:  Diagnosis Date  . ADD (attention deficit hyperactivity disorder, inattentive type)    e  . Chronic sinusitis   . Degenerative disk disease   . Dizziness   . Hyperlipidemia   . Kidney stones october 2012   passed it  . Knee problem    left knee  . Obesity   . PTSD (post-traumatic stress disorder)     PAST SURGICAL HISTORY: Past Surgical History:  Procedure Laterality Date  . back surgeries     3    FAMILY HISTORY: History  reviewed. No pertinent family history.  SOCIAL HISTORY: Social History   Social History  . Marital status: Married    Spouse name: Samara Deistkathryn  . Number of children: 2  . Years of education: 12   Occupational History  . retired    Social History Main Topics  . Smoking status: Former Smoker    Quit date: 09/29/1992  . Smokeless tobacco: Never Used  . Alcohol use No     Comment: quit in 1981  . Drug use: No  . Sexual activity: No   Other Topics Concern  . Not on file   Social History Narrative   Caffeine 3 cups in am, Married, 2 kids. Retired.        PHYSICAL EXAM  Vitals:   01/06/17 1318  BP: 125/78  Pulse: 93  Weight: 242 lb 6.4 oz (110 kg)   Body mass index is 32.88 kg/m.  Generalized: Well developed, in no acute distress   Neurological examination  Mentation: Alert oriented to time, place, history taking. Follows all commands speech and language fluent Cranial nerve II-XII: Pupils were equal round reactive to light. Extraocular movements were full, visual field were full on confrontational test. Facial sensation and strength were normal. Uvula tongue midline. Head turning and shoulder shrug  were normal and symmetric. Motor: The motor testing reveals 5 over 5 strength of all 4 extremities. Good symmetric motor tone is noted throughout.  Sensory: Sensory testing is intact to soft touch on all 4 extremities. No evidence of extinction is noted.  Coordination: Cerebellar testing reveals good finger-nose-finger and heel-to-shin bilaterally.  Gait and station: Gait is normal. Tandem gait is normal. Romberg is negative. No drift is seen.  Reflexes: Deep tendon reflexes are symmetric and normal bilaterally.   DIAGNOSTIC DATA (LABS, IMAGING, TESTING) - I reviewed patient records, labs, notes, testing and imaging myself where available.     ASSESSMENT AND PLAN 66 y.o. year old male  has a past medical history of ADD (attention deficit hyperactivity disorder, inattentive  type); Chronic sinusitis; Degenerative disk disease; Dizziness; Hyperlipidemia; Kidney stones (october 2012); Knee problem; Obesity; and PTSD (post-traumatic stress disorder). here with:  1. Attention deficit disorder  Overall the patient is doing well. He will continue Ritalin. His heart rate and blood pressure is in normal range. He is advised that if his symptoms worsen or he develops new symptoms he should let us know. He will follow-up in one year or sooner if needed.  I spent 15 minutes with the patient. 50% of this time was spent reviewing his medication and symptoms.      Butch PennyMegan Genesee Nase, MSN, NP-C 01/06/2017, 1:22 PM [facility] [address], [address] [place], [place] [id number] [phone]   

## 2017-01-06 NOTE — Patient Instructions (Signed)
Your Plan:  Continue Ritalin Blood pressure and Heart rate are in normal range. If your symptoms worsen or you develop new symptoms please let us know.    Thank you for coming to see us at [facility]. I hope we have been able to provide you high quality care today.  You may receive a patient satisfaction survey over the next few weeks. We would appreciate your feedback and comments so that we may continue to improve ourselves and the health of our patients.

## 2017-01-08 NOTE — Progress Notes (Signed)
I agree with the assessment and plan as directed by NP .The patient is known to me .   Mitchelle Sultan, MD  

## 2017-01-13 ENCOUNTER — Telehealth: Payer: Self-pay | Admitting: Adult Health

## 2017-01-13 ENCOUNTER — Telehealth: Payer: Self-pay | Admitting: Neurology

## 2017-01-13 MED ORDER — METHYLPHENIDATE HCL 10 MG PO TABS: 10.0000 mg | ORAL_TABLET | Freq: Every day | ORAL | 0 refills | Status: DC

## 2017-01-13 NOTE — Addendum Note (Signed)
Addended by: Joliyah Lippens on: 01/13/2017 12:11 PM   Modules accepted: Orders  

## 2017-01-13 NOTE — Telephone Encounter (Signed)
Patient requesting refill of methylphenidate (RITALIN) 10 MG tablet. ° ° °

## 2017-01-13 NOTE — Telephone Encounter (Signed)
Called pt and made him aware that his prescription was available for pick up. Verbalized understanding and plans to pick up on thursday

## 2017-02-06 ENCOUNTER — Other Ambulatory Visit (INDEPENDENT_AMBULATORY_CARE_PROVIDER_SITE_OTHER): Payer: Self-pay | Admitting: Orthopedic Surgery

## 2017-02-06 ENCOUNTER — Other Ambulatory Visit (INDEPENDENT_AMBULATORY_CARE_PROVIDER_SITE_OTHER): Payer: Self-pay | Admitting: Radiology

## 2017-02-06 MED ORDER — METHOCARBAMOL 500 MG PO TABS: 500.0000 mg | ORAL_TABLET | Freq: Two times a day (BID) | ORAL | 0 refills | Status: DC | PRN

## 2017-02-06 MED ORDER — METHOCARBAMOL 500 MG PO TABS: 500.0000 mg | ORAL_TABLET | Freq: Two times a day (BID) | ORAL | 0 refills | Status: DC

## 2017-02-06 NOTE — Telephone Encounter (Signed)
Called into pharmacy

## 2017-02-06 NOTE — Telephone Encounter (Signed)
Prescription written

## 2017-02-06 NOTE — Telephone Encounter (Signed)
Patients pharmacy sent paper refill request

## 2017-02-12 ENCOUNTER — Other Ambulatory Visit: Payer: Self-pay | Admitting: Neurology

## 2017-02-12 MED ORDER — METHYLPHENIDATE HCL 10 MG PO TABS: 10.0000 mg | ORAL_TABLET | Freq: Every day | ORAL | 0 refills | Status: DC

## 2017-02-12 NOTE — Telephone Encounter (Signed)
Patient called office requesting refill for methylphenidate (RITALIN) 10 MG tablet °

## 2017-02-12 NOTE — Addendum Note (Signed)
Addended by: Guy Begin on: 02/12/2017 04:45 PM   Modules accepted: Orders

## 2017-02-21 ENCOUNTER — Other Ambulatory Visit (INDEPENDENT_AMBULATORY_CARE_PROVIDER_SITE_OTHER): Payer: Self-pay | Admitting: Orthopedic Surgery

## 2017-03-05 ENCOUNTER — Ambulatory Visit (INDEPENDENT_AMBULATORY_CARE_PROVIDER_SITE_OTHER): Payer: Medicare Other | Admitting: Specialist

## 2017-03-17 ENCOUNTER — Other Ambulatory Visit: Payer: Self-pay | Admitting: Neurology

## 2017-03-17 ENCOUNTER — Telehealth: Payer: Self-pay | Admitting: Neurology

## 2017-03-17 MED ORDER — METHYLPHENIDATE HCL 10 MG PO TABS: 10.0000 mg | ORAL_TABLET | Freq: Every day | ORAL | 0 refills | Status: DC

## 2017-03-17 NOTE — Telephone Encounter (Signed)
Will be ready for patient to pick up.

## 2017-03-17 NOTE — Telephone Encounter (Signed)
Pt request refill for methylphenidate (RITALIN) 10 MG tablet . He will pick it up on Wednesday

## 2017-03-19 DIAGNOSIS — F900 Attention-deficit hyperactivity disorder, predominantly inattentive type: Secondary | ICD-10-CM | POA: Diagnosis not present

## 2017-03-19 DIAGNOSIS — E782 Mixed hyperlipidemia: Secondary | ICD-10-CM | POA: Diagnosis not present

## 2017-03-19 DIAGNOSIS — F338 Other recurrent depressive disorders: Secondary | ICD-10-CM | POA: Diagnosis not present

## 2017-03-19 DIAGNOSIS — E559 Vitamin D deficiency, unspecified: Secondary | ICD-10-CM | POA: Diagnosis not present

## 2017-03-19 DIAGNOSIS — R7301 Impaired fasting glucose: Secondary | ICD-10-CM | POA: Diagnosis not present

## 2017-03-19 DIAGNOSIS — Z8739 Personal history of other diseases of the musculoskeletal system and connective tissue: Secondary | ICD-10-CM | POA: Diagnosis not present

## 2017-03-19 DIAGNOSIS — E669 Obesity, unspecified: Secondary | ICD-10-CM | POA: Diagnosis not present

## 2017-03-19 DIAGNOSIS — R945 Abnormal results of liver function studies: Secondary | ICD-10-CM | POA: Diagnosis not present

## 2017-03-19 DIAGNOSIS — Z125 Encounter for screening for malignant neoplasm of prostate: Secondary | ICD-10-CM | POA: Diagnosis not present

## 2017-03-19 DIAGNOSIS — Z23 Encounter for immunization: Secondary | ICD-10-CM | POA: Diagnosis not present

## 2017-03-19 DIAGNOSIS — I10 Essential (primary) hypertension: Secondary | ICD-10-CM | POA: Diagnosis not present

## 2017-03-19 DIAGNOSIS — Z0000 Encounter for general adult medical examination without abnormal findings: Secondary | ICD-10-CM | POA: Diagnosis not present

## 2017-04-14 ENCOUNTER — Telehealth: Payer: Self-pay | Admitting: Adult Health

## 2017-04-14 MED ORDER — METHYLPHENIDATE HCL 10 MG PO TABS: 10.0000 mg | ORAL_TABLET | Freq: Every day | ORAL | 0 refills | Status: DC

## 2017-04-14 NOTE — Telephone Encounter (Signed)
Pt request refill for methylphenidate (RITALIN) 10 MG tablet . He will pick up on Thursday

## 2017-05-01 DIAGNOSIS — R748 Abnormal levels of other serum enzymes: Secondary | ICD-10-CM | POA: Diagnosis not present

## 2017-05-01 DIAGNOSIS — G894 Chronic pain syndrome: Secondary | ICD-10-CM | POA: Diagnosis not present

## 2017-05-12 ENCOUNTER — Telehealth: Payer: Self-pay | Admitting: Adult Health

## 2017-05-12 MED ORDER — METHYLPHENIDATE HCL 10 MG PO TABS: 10.0000 mg | ORAL_TABLET | Freq: Every day | ORAL | 0 refills | Status: DC

## 2017-05-12 NOTE — Telephone Encounter (Signed)
Pt request refill for methylphenidate (RITALIN) 10 MG tablet . Pt said he will be out on Monday, he is wanting to pick up refill on Wednesday, said he will not be able to get it filled until Monday.

## 2017-05-12 NOTE — Telephone Encounter (Signed)
Printed. Given to Mary Clare RN 

## 2017-06-04 ENCOUNTER — Other Ambulatory Visit: Payer: Self-pay | Admitting: Family Medicine

## 2017-06-04 DIAGNOSIS — R945 Abnormal results of liver function studies: Principal | ICD-10-CM

## 2017-06-04 DIAGNOSIS — R7989 Other specified abnormal findings of blood chemistry: Secondary | ICD-10-CM

## 2017-06-10 ENCOUNTER — Other Ambulatory Visit: Payer: Self-pay | Admitting: Adult Health

## 2017-06-10 NOTE — Telephone Encounter (Signed)
Ok to fill with note "ok to fill on or after 06/18/17"?

## 2017-06-10 NOTE — Telephone Encounter (Signed)
Pt calling re: his methylphenidate (RITALIN) 10 MG tablet prescription, he would like to know if he can pick it up on Friday the 21st and not fill it until 12-26.  Please call

## 2017-06-11 ENCOUNTER — Other Ambulatory Visit: Payer: Medicare Other

## 2017-06-11 MED ORDER — METHYLPHENIDATE HCL 10 MG PO TABS: 10.0000 mg | ORAL_TABLET | Freq: Every day | ORAL | 0 refills | Status: DC

## 2017-06-11 NOTE — Telephone Encounter (Signed)
Rx at the front, patient is aware. 

## 2017-06-11 NOTE — Addendum Note (Signed)
Addended by: Enedina FinnerMILLIKAN, Shaunda Tipping P on: 06/11/2017 04:16 PM   Modules accepted: Orders

## 2017-06-11 NOTE — Telephone Encounter (Signed)
Prescription is due- so ok for him to fill

## 2017-07-04 ENCOUNTER — Ambulatory Visit
Admission: RE | Admit: 2017-07-04 | Discharge: 2017-07-04 | Disposition: A | Discharge status: Home / Self Care | Payer: Medicare Other | Source: Ambulatory Visit | Attending: Family Medicine | Admitting: Family Medicine

## 2017-07-04 DIAGNOSIS — R7989 Other specified abnormal findings of blood chemistry: Secondary | ICD-10-CM

## 2017-07-04 DIAGNOSIS — R945 Abnormal results of liver function studies: Principal | ICD-10-CM

## 2017-07-15 ENCOUNTER — Other Ambulatory Visit: Payer: Self-pay | Admitting: Neurology

## 2017-07-15 ENCOUNTER — Telehealth: Payer: Self-pay | Admitting: Neurology

## 2017-07-15 MED ORDER — METHYLPHENIDATE HCL 10 MG PO TABS: 10.0000 mg | ORAL_TABLET | Freq: Every day | ORAL | 0 refills | Status: DC

## 2017-07-15 NOTE — Telephone Encounter (Signed)
Dr Dohmeier will be in the office later this afternoon and I will place the script at the front once she signs it after 2 pm.

## 2017-07-15 NOTE — Telephone Encounter (Signed)
Pt calling for a refill Rx of methylphenidate (RITALIN) 10 MG tablet, no changes to pt's insurance information

## 2017-08-15 ENCOUNTER — Other Ambulatory Visit: Payer: Self-pay | Admitting: Neurology

## 2017-08-15 ENCOUNTER — Telehealth: Payer: Self-pay | Admitting: Neurology

## 2017-08-15 MED ORDER — METHYLPHENIDATE HCL 10 MG PO TABS: 10.0000 mg | ORAL_TABLET | Freq: Every day | ORAL | 0 refills | Status: DC

## 2017-08-15 NOTE — Telephone Encounter (Signed)
Pt requesting a refill for methylphenidate (RITALIN) 10 MG tablet is aware the office closes at noon and will pick up medication on 2/25

## 2017-08-15 NOTE — Telephone Encounter (Signed)
Script will be ready for pick up. 

## 2017-09-10 ENCOUNTER — Telehealth: Payer: Self-pay | Admitting: Neurology

## 2017-09-10 ENCOUNTER — Other Ambulatory Visit: Payer: Self-pay | Admitting: Neurology

## 2017-09-10 DIAGNOSIS — M2351 Chronic instability of knee, right knee: Secondary | ICD-10-CM | POA: Diagnosis not present

## 2017-09-10 DIAGNOSIS — M25561 Pain in right knee: Secondary | ICD-10-CM | POA: Diagnosis not present

## 2017-09-10 DIAGNOSIS — M1711 Unilateral primary osteoarthritis, right knee: Secondary | ICD-10-CM | POA: Diagnosis not present

## 2017-09-10 DIAGNOSIS — G8929 Other chronic pain: Secondary | ICD-10-CM | POA: Diagnosis not present

## 2017-09-10 MED ORDER — METHYLPHENIDATE HCL 10 MG PO TABS: 10.0000 mg | ORAL_TABLET | Freq: Every day | ORAL | 0 refills | Status: DC

## 2017-09-10 NOTE — Telephone Encounter (Signed)
Pt requesting a refill for methylphenidate (RITALIN) 10 MG tablet wanting to pick up RX on Monday 3/25

## 2017-09-10 NOTE — Telephone Encounter (Signed)
Called the patient and made him aware that our office has started something new where we can not e fax his script for him to the pharmacy on file. I was able to verify the pharmacy on file and I told that we would send that over for him. Pt verbalized understanding and was excited for this transition

## 2017-09-11 ENCOUNTER — Other Ambulatory Visit: Payer: Self-pay | Admitting: Orthopedic Surgery

## 2017-09-11 DIAGNOSIS — R52 Pain, unspecified: Secondary | ICD-10-CM

## 2017-09-14 ENCOUNTER — Ambulatory Visit
Admission: RE | Admit: 2017-09-14 | Discharge: 2017-09-14 | Disposition: A | Discharge status: Home / Self Care | Payer: Medicare Other | Source: Ambulatory Visit | Attending: Orthopedic Surgery | Admitting: Orthopedic Surgery

## 2017-09-14 DIAGNOSIS — M25561 Pain in right knee: Secondary | ICD-10-CM | POA: Diagnosis not present

## 2017-09-14 DIAGNOSIS — R52 Pain, unspecified: Secondary | ICD-10-CM

## 2017-10-01 DIAGNOSIS — G8929 Other chronic pain: Secondary | ICD-10-CM | POA: Diagnosis not present

## 2017-10-01 DIAGNOSIS — S83241A Other tear of medial meniscus, current injury, right knee, initial encounter: Secondary | ICD-10-CM | POA: Diagnosis not present

## 2017-10-01 DIAGNOSIS — M1711 Unilateral primary osteoarthritis, right knee: Secondary | ICD-10-CM | POA: Diagnosis not present

## 2017-10-13 ENCOUNTER — Other Ambulatory Visit: Payer: Self-pay | Admitting: Neurology

## 2017-10-13 MED ORDER — METHYLPHENIDATE HCL 10 MG PO TABS: 10.0000 mg | ORAL_TABLET | Freq: Every day | ORAL | 0 refills | Status: DC

## 2017-10-30 DIAGNOSIS — G894 Chronic pain syndrome: Secondary | ICD-10-CM | POA: Diagnosis not present

## 2017-10-30 DIAGNOSIS — M47816 Spondylosis without myelopathy or radiculopathy, lumbar region: Secondary | ICD-10-CM | POA: Diagnosis not present

## 2017-11-15 ENCOUNTER — Other Ambulatory Visit: Payer: Self-pay | Admitting: Neurology

## 2017-11-18 ENCOUNTER — Other Ambulatory Visit: Payer: Self-pay | Admitting: Adult Health

## 2017-11-18 MED ORDER — METHYLPHENIDATE HCL 10 MG PO TABS: 10.0000 mg | ORAL_TABLET | Freq: Every day | ORAL | 0 refills | Status: DC

## 2017-11-18 NOTE — Telephone Encounter (Signed)
Pt called regardingmethylphenidate (RITALIN) 10 MG tablet refill. Pt said he is out of the medication since Sunday. Pt said he requested it from his pharmacy on Tuesday. I advised him to call the clinic each month for this medication so he would not run out. He was agreeable. He is needing this today.  Thank you

## 2017-12-15 ENCOUNTER — Other Ambulatory Visit: Payer: Self-pay | Admitting: Adult Health

## 2017-12-15 NOTE — Telephone Encounter (Signed)
Pt request refill for methylphenidate (RITALIN) 10 MG tablet. He will pick up med on 6/27 but will not run out until 6/28. [facility]

## 2017-12-16 MED ORDER — METHYLPHENIDATE HCL 10 MG PO TABS: 10.0000 mg | ORAL_TABLET | Freq: Every day | ORAL | 0 refills | Status: DC

## 2017-12-16 NOTE — Telephone Encounter (Signed)
Drug Registry checked last fill Ritalin 10mg  #30 [facility].  Last ofv visit 01-06-17, scheduled 01-06-18.

## 2017-12-17 MED ORDER — METHYLPHENIDATE HCL 10 MG PO TABS
10.0000 mg | ORAL_TABLET | Freq: Every day | ORAL | 0 refills | Status: DC
Start: 2017-12-17 — End: 2018-01-15

## 2017-12-17 NOTE — Addendum Note (Signed)
Addended by: Guy BeginYOUNG, Duel Conrad S on: 12/17/2017 08:40 AM   Modules accepted: Orders

## 2018-01-06 ENCOUNTER — Ambulatory Visit (INDEPENDENT_AMBULATORY_CARE_PROVIDER_SITE_OTHER): Payer: Medicare Other | Admitting: Adult Health

## 2018-01-06 ENCOUNTER — Encounter: Payer: Self-pay | Admitting: Adult Health

## 2018-01-06 VITALS — BP 124/74 | HR 98 | Ht 72.0 in | Wt 246.5 lb

## 2018-01-06 DIAGNOSIS — F988 Other specified behavioral and emotional disorders with onset usually occurring in childhood and adolescence: Secondary | ICD-10-CM | POA: Diagnosis not present

## 2018-01-06 NOTE — Progress Notes (Signed)
PATIENT: Stephen RubensFrank P York DOB: September 05, 1950  REASON FOR VISIT: follow up HISTORY FROM: patient  HISTORY OF PRESENT ILLNESS: Today 01/06/18 Stephen York is a 67 year old male with a history of ADD.  He returns today for follow-up.  He reports that he continues to do well on Ritalin.  Reports that he keeps a good check on his blood pressure.  Reports that it has remained in normal range.  He reports that has noticed some trouble with forgetfulness.  But reports that he is always able to recall what he is thinking.  He states for example sometimes he will go to the wrong cabinet but then will remember which one he needs to go to.  He denies any trouble driving.  He manages his finances without difficulty.  He returns today for an evaluation.   HISTORY 01/06/17 Stephen York is a 67 year old male with a history of attention deficit disorder. He returns today for follow-up. Overall he reports that he is doing quite well. He continues to take Ritalin and finds it very beneficial. He states sometimes in the day the effect will slowly wear off. He reports that he also checks his blood pressure. His blood pressure and heart rate remained normal range today. He is trying to diet and exercise. He denies any new neurological symptoms. He returns today for an evaluation.    REVIEW OF SYSTEMS: Out of a complete 14 system review of symptoms, the patient complains only of the following symptoms, and all other reviewed systems are negative.  See HPI  ALLERGIES: Allergies  Allergen Reactions  . Lipitor [Atorvastatin]     HOME MEDICATIONS: Outpatient Medications Prior to Visit  Medication Sig Dispense Refill  . ALPRAZolam (XANAX) 0.5 MG tablet Take 0.5 mg by mouth as needed.     Marland Kitchen. amLODipine (NORVASC) 10 MG tablet Take 10 mg by mouth daily.    Marland Kitchen. aspirin 81 MG chewable tablet Chew 81 mg by mouth daily.    Marland Kitchen. atorvastatin (LIPITOR) 10 MG tablet Take 10 mg by mouth daily.    . cholecalciferol (VITAMIN  D) 1000 UNITS tablet Take 2,000 Units by mouth daily.     Marland Kitchen. co-enzyme Q-10 50 MG capsule Take 100 mg by mouth daily.    Marland Kitchen. docusate sodium (COLACE) 100 MG capsule Take 100 mg by mouth daily.    . fenofibrate 160 MG tablet Take 160 mg by mouth daily.    . hydrochlorothiazide (HYDRODIURIL) 25 MG tablet Take 25 mg by mouth daily.    Marland Kitchen. HYDROcodone-acetaminophen (NORCO) 7.5-325 MG tablet Take 2 tablets by mouth daily.     Marland Kitchen. lisinopril (PRINIVIL,ZESTRIL) 40 MG tablet Take 40 mg by mouth daily.    . meloxicam (MOBIC) 15 MG tablet Take 15 mg by mouth daily.    . methocarbamol (ROBAXIN) 500 MG tablet TAKE 1 TABLET TWICE A DAY AS NEEDED FOR SPASMS 20 tablet 0  . methylphenidate (RITALIN) 10 MG tablet Take 1 tablet (10 mg total) by mouth daily. 30 tablet 0  . Omega-3 Fatty Acids (FISH OIL PO) Take 1,000 mg by mouth daily.    . ranitidine (ZANTAC) 150 MG tablet Take 150 mg by mouth 2 (two) times daily.    . sertraline (ZOLOFT) 100 MG tablet     . tiZANidine (ZANAFLEX) 4 MG tablet Take 4 mg by mouth daily.    . vitamin E 1000 UNIT capsule Take 400 Units by mouth daily.     . methocarbamol (ROBAXIN) 500 MG tablet Take 1  tablet (500 mg total) by mouth 2 (two) times daily as needed for muscle spasms. 20 tablet 0   No facility-administered medications prior to visit.     PAST MEDICAL HISTORY: Past Medical History:  Diagnosis Date  . ADD (attention deficit hyperactivity disorder, inattentive type)    e  . Chronic sinusitis   . Degenerative disk disease   . Dizziness   . Hyperlipidemia   . Kidney stones october 2012   passed it  . Knee problem    left knee  . Obesity   . PTSD (post-traumatic stress disorder)     PAST SURGICAL HISTORY: Past Surgical History:  Procedure Laterality Date  . back surgeries     3    FAMILY HISTORY: No family history on file.  SOCIAL HISTORY: Social History   Socioeconomic History  . Marital status: Married    Spouse name: Samara Deist  . Number of children: 2    . Years of education: 38  . Highest education level: Not on file  Occupational History  . Occupation: retired  Engineer, production  . Financial resource strain: Not on file  . Food insecurity:    Worry: Not on file    Inability: Not on file  . Transportation needs:    Medical: Not on file    Non-medical: Not on file  Tobacco Use  . Smoking status: Former Smoker    Last attempt to quit: 09/29/1992    Years since quitting: 25.2  . Smokeless tobacco: Never Used  Substance and Sexual Activity  . Alcohol use: No    Alcohol/week: 0.0 oz    Comment: quit in 1981  . Drug use: No  . Sexual activity: Never  Lifestyle  . Physical activity:    Days per week: Not on file    Minutes per session: Not on file  . Stress: Not on file  Relationships  . Social connections:    Talks on phone: Not on file    Gets together: Not on file    Attends religious service: Not on file    Active member of club or organization: Not on file    Attends meetings of clubs or organizations: Not on file    Relationship status: Not on file  . Intimate partner violence:    Fear of current or ex partner: Not on file    Emotionally abused: Not on file    Physically abused: Not on file    Forced sexual activity: Not on file  Other Topics Concern  . Not on file  Social History Narrative   Caffeine 3 cups in am, Married, 2 kids. Retired.        PHYSICAL EXAM  Vitals:   01/06/18 1311  BP: 124/74  Pulse: 98  Weight: 246 lb 8 oz (111.8 kg)  Height: 6' (1.829 m)   Body mass index is 33.43 kg/m.  Generalized: Well developed, in no acute distress   Neurological examination  Mentation: Alert oriented to time, place, history taking. Follows all commands speech and language fluent Cranial nerve II-XII: Pupils were equal round reactive to light. Extraocular movements were full, visual field were full on confrontational test. Facial sensation and strength were normal. Uvula tongue midline. Head turning and shoulder  shrug  were normal and symmetric. Motor: The motor testing reveals 5 over 5 strength of all 4 extremities. Good symmetric motor tone is noted throughout.  Sensory: Sensory testing is intact to soft touch on all 4 extremities. No evidence of extinction  is noted.  Coordination: Cerebellar testing reveals good finger-nose-finger and heel-to-shin bilaterally.  Gait and station: Gait is normal. Tandem gait is normal. Romberg is negative. No drift is seen.  Reflexes: Deep tendon reflexes are symmetric and normal bilaterally.   DIAGNOSTIC DATA (LABS, IMAGING, TESTING) - I reviewed patient records, labs, notes, testing and imaging myself where available.     ASSESSMENT AND PLAN 67 y.o. year old male  has a past medical history of ADD (attention deficit hyperactivity disorder, inattentive type), Chronic sinusitis, Degenerative disk disease, Dizziness, Hyperlipidemia, Kidney stones (october 2012), Knee problem, Obesity, and PTSD (post-traumatic stress disorder). here with:  1.  ADD  Overall the patient is doing well.  He will continue on Ritalin.  His blood pressure and heart rate are in normal range today.  He will continue to monitor his memory.  If he notices any concerning symptoms we can complete a formal memory evaluation.  Patient voiced understanding.  He will follow-up in 1 year or  sooner if needed  I spent 15 minutes with the patient. 50% of this time was spent discussing his medication   Butch Penny, MSN, NP-C 01/06/2018, 1:29 PM [facility] [address], [address] [place], [place] [address] [phone]

## 2018-01-06 NOTE — Patient Instructions (Signed)
Your Plan:  Continue Ritalin If your symptoms worsen or you develop new symptoms please let us know.   Thank you for coming to see us at [facility]. I hope we have been able to provide you high quality care today.  You may receive a patient satisfaction survey over the next few weeks. We would appreciate your feedback and comments so that we may continue to improve ourselves and the health of our patients.  

## 2018-01-14 ENCOUNTER — Telehealth: Payer: Self-pay | Admitting: Adult Health

## 2018-01-14 NOTE — Telephone Encounter (Signed)
Pt caleld requesting a refill for methylphenidate (RITALIN) 10 MG tablet [facility] set to fill on or after 7/26.

## 2018-01-15 MED ORDER — METHYLPHENIDATE HCL 10 MG PO TABS
10.0000 mg | ORAL_TABLET | Freq: Every day | ORAL | 0 refills | Status: DC
Start: 2018-01-15 — End: 2018-02-12

## 2018-01-23 NOTE — Progress Notes (Signed)
I agree with the assessment and plan as directed by NP .The patient is known to me .   Fermina Mishkin, MD  

## 2018-02-09 ENCOUNTER — Telehealth: Payer: Self-pay | Admitting: Adult Health

## 2018-02-09 NOTE — Telephone Encounter (Signed)
Pt called stating he is needing a refill for methylphenidate (RITALIN) 10 MG tablet [facility] stating he is wanting to pick it up on Saturday 8/24

## 2018-02-09 NOTE — Telephone Encounter (Signed)
Registry verified. Ritalin 10 mg was last picked up on 01/15/2018 for number 30. Patient would like to pick this rx up on 02/14/2018.

## 2018-02-12 MED ORDER — METHYLPHENIDATE HCL 10 MG PO TABS: 10.0000 mg | ORAL_TABLET | Freq: Every day | ORAL | 0 refills | Status: DC

## 2018-02-12 NOTE — Addendum Note (Signed)
Addended by: Enedina FinnerMILLIKAN, Vinaya Sancho P on: 02/12/2018 03:35 PM   Modules accepted: Orders

## 2018-02-25 NOTE — Progress Notes (Signed)
I agree with the assessment and plan as directed by NP .The patient is known to me .   Trampus Mcquerry, MD  

## 2018-03-14 ENCOUNTER — Other Ambulatory Visit: Payer: Self-pay | Admitting: Adult Health

## 2018-04-09 DIAGNOSIS — Z0000 Encounter for general adult medical examination without abnormal findings: Secondary | ICD-10-CM | POA: Diagnosis not present

## 2018-04-09 DIAGNOSIS — I10 Essential (primary) hypertension: Secondary | ICD-10-CM | POA: Diagnosis not present

## 2018-04-09 DIAGNOSIS — F900 Attention-deficit hyperactivity disorder, predominantly inattentive type: Secondary | ICD-10-CM | POA: Diagnosis not present

## 2018-04-09 DIAGNOSIS — F338 Other recurrent depressive disorders: Secondary | ICD-10-CM | POA: Diagnosis not present

## 2018-04-09 DIAGNOSIS — Z125 Encounter for screening for malignant neoplasm of prostate: Secondary | ICD-10-CM | POA: Diagnosis not present

## 2018-04-09 DIAGNOSIS — R7301 Impaired fasting glucose: Secondary | ICD-10-CM | POA: Diagnosis not present

## 2018-04-09 DIAGNOSIS — Z23 Encounter for immunization: Secondary | ICD-10-CM | POA: Diagnosis not present

## 2018-04-09 DIAGNOSIS — E782 Mixed hyperlipidemia: Secondary | ICD-10-CM | POA: Diagnosis not present

## 2018-04-09 DIAGNOSIS — Z8739 Personal history of other diseases of the musculoskeletal system and connective tissue: Secondary | ICD-10-CM | POA: Diagnosis not present

## 2018-04-13 ENCOUNTER — Other Ambulatory Visit: Payer: Self-pay | Admitting: Adult Health

## 2018-05-01 DIAGNOSIS — G894 Chronic pain syndrome: Secondary | ICD-10-CM | POA: Diagnosis not present

## 2018-05-01 DIAGNOSIS — Z79899 Other long term (current) drug therapy: Secondary | ICD-10-CM | POA: Diagnosis not present

## 2018-05-01 DIAGNOSIS — M47816 Spondylosis without myelopathy or radiculopathy, lumbar region: Secondary | ICD-10-CM | POA: Diagnosis not present

## 2018-05-11 ENCOUNTER — Other Ambulatory Visit: Payer: Self-pay | Admitting: Adult Health

## 2018-06-09 ENCOUNTER — Other Ambulatory Visit: Payer: Self-pay | Admitting: Adult Health

## 2018-06-09 NOTE — Telephone Encounter (Signed)
Drug Registry checked Ritalin 10mg  #30.  Last fill 05/15/2018.  (states realizes its a few days early, will not fill till  12/21 Saturday (just wants to be sure Rx is available).

## 2018-06-09 NOTE — Telephone Encounter (Signed)
Pt called stating he will need refills for methylphenidate (RITALIN) 10 MG tablet on 12/22 needing the ok to refill on 12/21 [facility]

## 2018-06-11 MED ORDER — METHYLPHENIDATE HCL 10 MG PO TABS: 10.0000 mg | ORAL_TABLET | Freq: Every day | ORAL | 0 refills | Status: DC

## 2018-07-09 ENCOUNTER — Other Ambulatory Visit: Payer: Self-pay | Admitting: Adult Health

## 2018-08-07 ENCOUNTER — Other Ambulatory Visit: Payer: Self-pay | Admitting: Adult Health

## 2018-08-31 ENCOUNTER — Emergency Department (HOSPITAL_COMMUNITY)
Admission: EM | Admit: 2018-08-31 | Discharge: 2018-08-31 | Disposition: A | Discharge status: Home / Self Care | Payer: Medicare Other | Attending: Emergency Medicine | Admitting: Emergency Medicine

## 2018-08-31 DIAGNOSIS — E785 Hyperlipidemia, unspecified: Secondary | ICD-10-CM | POA: Insufficient documentation

## 2018-08-31 DIAGNOSIS — Z7982 Long term (current) use of aspirin: Secondary | ICD-10-CM | POA: Insufficient documentation

## 2018-08-31 DIAGNOSIS — B029 Zoster without complications: Secondary | ICD-10-CM | POA: Diagnosis not present

## 2018-08-31 DIAGNOSIS — I10 Essential (primary) hypertension: Secondary | ICD-10-CM | POA: Insufficient documentation

## 2018-08-31 DIAGNOSIS — Z87891 Personal history of nicotine dependence: Secondary | ICD-10-CM | POA: Insufficient documentation

## 2018-08-31 DIAGNOSIS — Z79899 Other long term (current) drug therapy: Secondary | ICD-10-CM | POA: Diagnosis not present

## 2018-08-31 DIAGNOSIS — R21 Rash and other nonspecific skin eruption: Secondary | ICD-10-CM | POA: Diagnosis present

## 2018-08-31 LAB — BASIC METABOLIC PANEL
Anion gap: 8 (ref 5–15)
BUN: 13 mg/dL (ref 8–23)
CO2: 24 mmol/L (ref 22–32)
Calcium: 9.6 mg/dL (ref 8.9–10.3)
Chloride: 107 mmol/L (ref 98–111)
Creatinine, Ser: 0.89 mg/dL (ref 0.61–1.24)
GFR calc Af Amer: >60 mL/min (ref >60)
GFR calc non Af Amer: >60 mL/min (ref >60)
Glucose, Bld: 112 mg/dL — ABNORMAL HIGH (ref 70–99)
Potassium: 3.9 mmol/L (ref 3.5–5.1)
Sodium: 139 mmol/L (ref 135–145)

## 2018-08-31 MED ORDER — PREDNISONE 20 MG PO TABS: 40.0000 mg | ORAL_TABLET | Freq: Every day | ORAL | 0 refills | Status: DC

## 2018-08-31 MED ORDER — VALACYCLOVIR HCL 1 G PO TABS: 1000.0000 mg | ORAL_TABLET | Freq: Three times a day (TID) | ORAL | 0 refills | Status: DC

## 2018-08-31 NOTE — ED Provider Notes (Signed)
[facility] Provider Note   CSN: [id number] Arrival date & time: 08/31/18  0932    History   Chief Complaint Chief Complaint  Patient presents with  . Rash    HPI Ileene RubensFrank P Foulks is a 68 y.o. male.     The history is provided by the patient.  Rash  Location:  Torso Torso rash location:  Abd LUQ Quality: blistering, painful and redness   Pain details:    Quality:  Sharp and shooting   Severity:  Severe   Onset quality:  Gradual   Duration:  8 days   Timing:  Constant   Progression:  Worsening Severity:  Severe Onset quality:  Sudden Duration:  8 days Timing:  Constant Progression:  Worsening Chronicity:  New Context comment:  Nothing Relieved by:  Nothing Worsened by:  Nothing Ineffective treatments:  None tried Associated symptoms: no abdominal pain, no diarrhea, no fever, no headaches, no joint pain, no nausea, no periorbital edema, no shortness of breath, no throat swelling, no tongue swelling, no URI and not vomiting     Past Medical History:  Diagnosis Date  . ADD (attention deficit hyperactivity disorder, inattentive type)    e  . Chronic sinusitis   . Degenerative disk disease   . Dizziness   . Hyperlipidemia   . Kidney stones october 2012   passed it  . Knee problem    left knee  . Obesity   . PTSD (post-traumatic stress disorder)     Patient Active Problem List   Diagnosis Date Noted  . ADD (attention deficit hyperactivity disorder, inattentive type) 07/01/2014  . Essential hypertension 07/01/2014  . Attention deficit disorder without mention of hyperactivity 04/07/2013  . Displacement of lumbar intervertebral disc without myelopathy 04/07/2013  . Unspecified essential hypertension 04/07/2013    Past Surgical History:  Procedure Laterality Date  . back surgeries     3        Home Medications    Prior to Admission medications   Medication Sig Start Date End Date Taking? Authorizing Provider   ALPRAZolam Prudy Feeler(XANAX) 0.5 MG tablet Take 0.5 mg by mouth as needed.  04/06/15   [provider]  amLODipine (NORVASC) 10 MG tablet Take 10 mg by mouth daily.    [provider]  aspirin 81 MG chewable tablet Chew 81 mg by mouth daily.    [provider]  atorvastatin (LIPITOR) 10 MG tablet Take 10 mg by mouth daily.    [provider]  cholecalciferol (VITAMIN D) 1000 UNITS tablet Take 2,000 Units by mouth daily.     [provider]  co-enzyme Q-10 50 MG capsule Take 100 mg by mouth daily.    [provider]  docusate sodium (COLACE) 100 MG capsule Take 100 mg by mouth daily.    [provider]  fenofibrate 160 MG tablet Take 160 mg by mouth daily.    [provider]  hydrochlorothiazide (HYDRODIURIL) 25 MG tablet Take 25 mg by mouth daily.    [provider]  HYDROcodone-acetaminophen (NORCO) 7.5-325 MG tablet Take 2 tablets by mouth daily.  06/07/15   [provider]  lisinopril (PRINIVIL,ZESTRIL) 40 MG tablet Take 40 mg by mouth daily. 04/22/16   [provider]  meloxicam (MOBIC) 15 MG tablet Take 15 mg by mouth daily.    [provider]  methocarbamol (ROBAXIN) 500 MG tablet TAKE 1 TABLET TWICE A DAY AS NEEDED FOR SPASMS 02/21/17   Barnie DelZamora, Erin  R, NP  methylphenidate (RITALIN) 10 MG tablet TAKE 1 TABLET DAILY 08/07/18   Dohmeier, Porfirio Mylar, MD  Omega-3 Fatty Acids (FISH OIL PO) Take 1,000 mg by mouth daily.    [provider]  ranitidine (ZANTAC) 150 MG tablet Take 150 mg by mouth 2 (two) times daily.    [provider]  sertraline (ZOLOFT) 100 MG tablet  06/22/14   [provider]  tiZANidine (ZANAFLEX) 4 MG tablet Take 4 mg by mouth daily. 04/06/13   [provider]  vitamin E 1000 UNIT capsule Take 400 Units by mouth daily.     [provider]    Family History No family history on file.  Social History Social History   Tobacco Use  .  Smoking status: Former Smoker    Last attempt to quit: 09/29/1992    Years since quitting: 25.9  . Smokeless tobacco: Never Used  Substance Use Topics  . Alcohol use: No    Alcohol/week: 0.0 standard drinks    Comment: quit in 1981  . Drug use: No     Allergies   Lipitor [atorvastatin]   Review of Systems Review of Systems  Constitutional: Negative for fever.  Respiratory: Negative for shortness of breath.   Gastrointestinal: Negative for abdominal pain, diarrhea, nausea and vomiting.  Musculoskeletal: Negative for arthralgias.  Skin: Positive for rash.  Neurological: Negative for headaches.  All other systems reviewed and are negative.    Physical Exam Updated Vital Signs BP (!) 176/94 (BP Location: Left Arm)   Pulse 95   Temp 99.4 F (37.4 C) (Oral)   Resp 18   SpO2 95%   Physical Exam Vitals signs and nursing note reviewed.  Constitutional:      General: He is not in acute distress.    Appearance: He is well-developed.  HENT:     Head: Normocephalic and atraumatic.  Eyes:     Conjunctiva/sclera: Conjunctivae normal.     Pupils: Pupils are equal, round, and reactive to light.  Neck:     Musculoskeletal: Normal range of motion and neck supple.  Cardiovascular:     Rate and Rhythm: Normal rate and regular rhythm.     Heart sounds: No murmur.  Pulmonary:     Effort: Pulmonary effort is normal. No respiratory distress.     Breath sounds: Normal breath sounds. No wheezing or rales.  Abdominal:     General: There is no distension.     Palpations: Abdomen is soft.     Tenderness: There is no abdominal tenderness. There is no guarding or rebound.       Comments: Vesicular rash present in the abdomen wrapping around to the back at T8/9 dermatoma  Musculoskeletal: Normal range of motion.        General: No tenderness.  Skin:    General: Skin is warm and dry.     Capillary Refill: Capillary refill takes less than 2 seconds.     Findings: Rash present. No  erythema.  Neurological:     Mental Status: He is alert and oriented to person, place, and time.  Psychiatric:        Behavior: Behavior normal.      ED Treatments / Results  Labs (all labs ordered are listed, but only abnormal results are displayed) Labs Reviewed  BASIC METABOLIC PANEL - Abnormal; Notable for the following components:      Result Value   Glucose, Bld 112 (*)    All other components within normal limits  EKG None  Radiology No results found.  Procedures Procedures (including critical care time)  Medications Ordered in ED Medications - No data to display   Initial Impression / Assessment and Plan / ED Course  I have reviewed the triage vital signs and the nursing notes.  Pertinent labs & imaging results that were available during my care of the patient were reviewed by me and considered in my medical decision making (see chart for details).      Patient is a 68 year old male with a history of PTSD, chronic sinusitis, hyperlipidemia presenting today with 8 days of severe abdominal pain that is exacerbated by touch.  He denies any fever, decreased oral intake, vomiting or diarrhea.  Patient is noted to have a vesicular rash on the left side of his abdomen where he is having the pain and suspect all of his symptoms today are due to zoster.  Patient states he did receive the shingles shot but is not sure if he has had the newest one or the older one.  He is seen at the [place] and states he last had blood work done approximately 1 year ago.  Patient will get a BMP to ensure normal kidney function and no evidence of diabetes.  He will be started on Valtrex and prednisone.   11:11 AM BMP wihtout acute concern and pt d/ced home with meds and f/u.  Final Clinical Impressions(s) / ED Diagnoses   Final diagnoses:  Herpes zoster without complication    ED Discharge Orders         Ordered    valACYclovir (VALTREX) 1000 MG tablet  3 times daily     08/31/18 1110      predniSONE (DELTASONE) 20 MG tablet  Daily     08/31/18 1110           Gwyneth Sprout, MD 08/31/18 1111

## 2018-09-09 ENCOUNTER — Other Ambulatory Visit: Payer: Self-pay | Admitting: Neurology

## 2018-09-09 NOTE — Telephone Encounter (Signed)
Drug Registry last fill states 12-18-17 for ritalin.  I called pharmacy, [facility] [phone], last fill 08-10-18 # 30.  Refill for pick up Saturday 09-12-18 per pharmacist.

## 2018-09-16 NOTE — Telephone Encounter (Signed)
Spoke to [employer] at [facility].  He faxed to [place] drug registry list copy (as could not get from our drug registry).  Last fill date 09-09-18 # 30 CD methylphenidate 10mg . Prior to that 08-10-18 # 30 CD.

## 2018-10-07 ENCOUNTER — Other Ambulatory Visit: Payer: Self-pay | Admitting: Neurology

## 2018-11-09 ENCOUNTER — Other Ambulatory Visit: Payer: Self-pay | Admitting: Neurology

## 2018-12-02 ENCOUNTER — Other Ambulatory Visit: Payer: Self-pay | Admitting: Adult Health

## 2018-12-02 NOTE — Telephone Encounter (Signed)
Spoke to pt and he was using [facility] now.  He will need ritalin refill around 11-09-18.  I relayed will go ahead and  Place request as he did not need to call pharmacy.  He appreciated this.  Drug registry checked. Last fill 11-09-18.  Using [facility] now.

## 2018-12-02 NOTE — Telephone Encounter (Signed)
Pt called wanting to inform us that from now on he would like his refills sent to [facility] Please advise.

## 2018-12-02 NOTE — Telephone Encounter (Signed)
Noted  

## 2018-12-07 MED ORDER — METHYLPHENIDATE HCL 10 MG PO TABS: 10.0000 mg | ORAL_TABLET | Freq: Every day | ORAL | 0 refills | Status: DC

## 2019-01-07 ENCOUNTER — Other Ambulatory Visit: Payer: Self-pay | Admitting: Adult Health

## 2019-01-07 NOTE — Telephone Encounter (Signed)
Drug registry last fill ritalin #30 10mg  tabs 12-09-18.

## 2019-01-11 ENCOUNTER — Telehealth: Payer: Self-pay | Admitting: Adult Health

## 2019-01-11 ENCOUNTER — Telehealth: Payer: Self-pay | Admitting: Neurology

## 2019-01-11 ENCOUNTER — Other Ambulatory Visit: Payer: Self-pay | Admitting: Neurology

## 2019-01-11 NOTE — Telephone Encounter (Signed)
Pt is wanting to now why his methylphenidate (RITALIN) 10 MG tablet has not been filled. Please advise.

## 2019-01-11 NOTE — Telephone Encounter (Signed)
I called patient regarding his 7/21 appointment. I advised patient that his appt will need to be a virtual visit, or he will need to reschedule for in office due to the schedule changes our office has made. Patient agreed to do a virtual visit via doxy. I have sent patient an e-mail with the link and directions for the visit to the e-mail provided in his chart. Patient verbalized understanding of this.  Pt understands that although there may be some limitations with this type of visit, we will take all precautions to reduce any security or privacy concerns.  Pt understands that this will be treated like an in office visit and we will file with pt's insurance, and there may be a patient responsible charge related to this service.

## 2019-01-11 NOTE — Telephone Encounter (Signed)
Its been  sent

## 2019-01-12 ENCOUNTER — Telehealth (INDEPENDENT_AMBULATORY_CARE_PROVIDER_SITE_OTHER): Payer: Medicare Other | Admitting: Adult Health

## 2019-01-12 DIAGNOSIS — R413 Other amnesia: Secondary | ICD-10-CM | POA: Diagnosis not present

## 2019-01-12 DIAGNOSIS — F988 Other specified behavioral and emotional disorders with onset usually occurring in childhood and adolescence: Secondary | ICD-10-CM

## 2019-01-12 NOTE — Progress Notes (Signed)
PATIENT: Stephen York DOB: Nov 08, 1950  REASON FOR VISIT: follow up HISTORY FROM: patient  Virtual Visit via Video Note  I connected with Jamse MeadFrank P Venning on 01/12/19 at  1:30 PM EDT by a video enabled telemedicine application located remotely at [facility] and verified that I am speaking with the correct person using two identifiers who was located at their own home.   I discussed the limitations of evaluation and management by telemedicine and the availability of in person appointments. The patient expressed understanding and agreed to proceed.   PATIENT: Stephen RubensFrank P Rodden DOB: Nov 08, 1950  REASON FOR VISIT: follow up HISTORY FROM: patient  HISTORY OF PRESENT ILLNESS: Today 01/12/19:  [name] is a 68 year old male with a history of ADD.  He returns today for virtual visit.  He reports that Ritalin continues to work well for him.  He states that he continues to have trouble with forgetfulness.  He typically can remember what he was thinking about at a later time.  He reports that his wife also makes note of some changes with his memory.  He is able to complete all ADLs independently.  He operates a Librarian, academicmotor vehicle without difficulty.  His wife manages the finances.  He joins me today for a virtual visit.  HISTORY 01/06/18 [name] is a 68 year old male with a history of ADD.  He returns today for follow-up.  He reports that he continues to do well on Ritalin.  Reports that he keeps a good check on his blood pressure.  Reports that it has remained in normal range.  He reports that has noticed some trouble with forgetfulness.  But reports that he is always able to recall what he is thinking.  He states for example sometimes he will go to the wrong cabinet but then will remember which one he needs to go to.  He denies any trouble driving.  He manages his finances without difficulty.  He returns today for an evaluation.  REVIEW OF SYSTEMS: Out of a complete  14 system review of symptoms, the patient complains only of the following symptoms, and all other reviewed systems are negative.  ALLERGIES: Allergies  Allergen Reactions  . Lipitor [Atorvastatin]     HOME MEDICATIONS: Outpatient Medications Prior to Visit  Medication Sig Dispense Refill  . ALPRAZolam (XANAX) 0.5 MG tablet Take 0.5 mg by mouth as needed.     Marland Kitchen. amLODipine (NORVASC) 10 MG tablet Take 10 mg by mouth daily.    Marland Kitchen. aspirin 81 MG chewable tablet Chew 81 mg by mouth daily.    Marland Kitchen. atorvastatin (LIPITOR) 10 MG tablet Take 10 mg by mouth daily.    . cholecalciferol (VITAMIN D) 1000 UNITS tablet Take 2,000 Units by mouth daily.     Marland Kitchen. co-enzyme Q-10 50 MG capsule Take 100 mg by mouth daily.    Marland Kitchen. docusate sodium (COLACE) 100 MG capsule Take 100 mg by mouth daily.    . fenofibrate 160 MG tablet Take 160 mg by mouth daily.    . hydrochlorothiazide (HYDRODIURIL) 25 MG tablet Take 25 mg by mouth daily.    Marland Kitchen. HYDROcodone-acetaminophen (NORCO) 7.5-325 MG tablet Take 2 tablets by mouth daily.     Marland Kitchen. lisinopril (PRINIVIL,ZESTRIL) 40 MG tablet Take 40 mg by mouth daily.    . meloxicam (MOBIC) 15 MG tablet Take 15 mg by mouth daily.    . methocarbamol (ROBAXIN) 500 MG tablet TAKE 1 TABLET TWICE A DAY AS NEEDED FOR SPASMS  20 tablet 0  . methylphenidate (RITALIN) 10 MG tablet TAKE 1 TABLET DAILY 30 tablet 0  . Omega-3 Fatty Acids (FISH OIL PO) Take 1,000 mg by mouth daily.    . predniSONE (DELTASONE) 20 MG tablet Take 2 tablets (40 mg total) by mouth daily. 10 tablet 0  . ranitidine (ZANTAC) 150 MG tablet Take 150 mg by mouth 2 (two) times daily.    . sertraline (ZOLOFT) 100 MG tablet     . tiZANidine (ZANAFLEX) 4 MG tablet Take 4 mg by mouth daily.    . valACYclovir (VALTREX) 1000 MG tablet Take 1 tablet (1,000 mg total) by mouth 3 (three) times daily. 21 tablet 0  . vitamin E 1000 UNIT capsule Take 400 Units by mouth daily.      No facility-administered medications prior to visit.     PAST  MEDICAL HISTORY: Past Medical History:  Diagnosis Date  . ADD (attention deficit hyperactivity disorder, inattentive type)    e  . Chronic sinusitis   . Degenerative disk disease   . Dizziness   . Hyperlipidemia   . Kidney stones october 2012   passed it  . Knee problem    left knee  . Obesity   . PTSD (post-traumatic stress disorder)     PAST SURGICAL HISTORY: Past Surgical History:  Procedure Laterality Date  . back surgeries     3    FAMILY HISTORY: No family history on file.  SOCIAL HISTORY: Social History   Socioeconomic History  . Marital status: Married    Spouse name: Curt Bears  . Number of children: 2  . Years of education: 54  . Highest education level: Not on file  Occupational History  . Occupation: retired  Scientific laboratory technician  . Financial resource strain: Not on file  . Food insecurity    Worry: Not on file    Inability: Not on file  . Transportation needs    Medical: Not on file    Non-medical: Not on file  Tobacco Use  . Smoking status: Former Smoker    Quit date: 09/29/1992    Years since quitting: 26.3  . Smokeless tobacco: Never Used  Substance and Sexual Activity  . Alcohol use: No    Alcohol/week: 0.0 standard drinks    Comment: quit in 1981  . Drug use: No  . Sexual activity: Never  Lifestyle  . Physical activity    Days per week: Not on file    Minutes per session: Not on file  . Stress: Not on file  Relationships  . Social Herbalist on phone: Not on file    Gets together: Not on file    Attends religious service: Not on file    Active member of club or organization: Not on file    Attends meetings of clubs or organizations: Not on file    Relationship status: Not on file  . Intimate partner violence    Fear of current or ex partner: Not on file    Emotionally abused: Not on file    Physically abused: Not on file    Forced sexual activity: Not on file  Other Topics Concern  . Not on file  Social History Narrative    Caffeine 3 cups in am, Married, 2 kids. Retired.        PHYSICAL EXAM Generalized: Well developed, in no acute distress   Neurological examination  Mentation: Alert oriented to time, place, history taking. Follows all commands speech and  language fluent.  For memory testing unable to do calculation and difficulty with recall. Cranial nerve II-XII:Extraocular movements were full. Facial symmetry noted. Motor: Good strength throughout subjectively per patient Sensory: Sensory testing is intact to soft touch on all 4 extremities subjectively per patient Coordination: Cerebellar testing reveals good finger-nose-finger   DIAGNOSTIC DATA (LABS, IMAGING, TESTING) - I reviewed patient records, labs, notes, testing and imaging myself where available.  No results found for: WBC, HGB, HCT, MCV, PLT    Component Value Date/Time   NA 139 08/31/2018 1017   K 3.9 08/31/2018 1017   CL 107 08/31/2018 1017   CO2 24 08/31/2018 1017   GLUCOSE 112 (H) 08/31/2018 1017   BUN 13 08/31/2018 1017   CREATININE 0.89 08/31/2018 1017   CALCIUM 9.6 08/31/2018 1017   GFRNONAA >60 08/31/2018 1017   GFRAA >60 08/31/2018 1017      ASSESSMENT AND PLAN 68 y.o. year old male  has a past medical history of ADD (attention deficit hyperactivity disorder, inattentive type), Chronic sinusitis, Degenerative disk disease, Dizziness, Hyperlipidemia, Kidney stones (october 2012), Knee problem, Obesity, and PTSD (post-traumatic stress disorder). here with:  1.  ADD 2.  Memory disturbance  The patient will continue on Ritalin.  The patient continues to notice some trouble with his memory.  I will have him come into the office in the next couple weeks for formal memory evaluation.  Patient is amenable to this.  He will follow-up at that time.   I spent 15 minutes with the patient. 50% of this time was spent discussing his memory and medication.   Butch PennyMegan Brenda Cowher, MSN, NP-C 01/12/2019, 2:03 PM [facility] [address], [address] [place], [place] [address] [phone]

## 2019-01-31 ENCOUNTER — Other Ambulatory Visit: Payer: Self-pay

## 2019-01-31 ENCOUNTER — Emergency Department (HOSPITAL_COMMUNITY): Payer: Medicare Other

## 2019-01-31 ENCOUNTER — Emergency Department (HOSPITAL_COMMUNITY)
Admission: EM | Admit: 2019-01-31 | Discharge: 2019-02-01 | Disposition: A | Discharge status: Home / Self Care | Payer: Medicare Other | Attending: Emergency Medicine | Admitting: Emergency Medicine

## 2019-01-31 DIAGNOSIS — R0789 Other chest pain: Secondary | ICD-10-CM | POA: Insufficient documentation

## 2019-01-31 DIAGNOSIS — Z5321 Procedure and treatment not carried out due to patient leaving prior to being seen by health care provider: Secondary | ICD-10-CM | POA: Insufficient documentation

## 2019-01-31 NOTE — ED Triage Notes (Signed)
Patient c/o tripping and then falling. States fell on the rground and landed on arm. Denies dizziness. Complain of left rib pain.

## 2019-02-01 NOTE — ED Notes (Signed)
Pt left, states tired of waiting. Encouraged to stay.

## 2019-02-06 ENCOUNTER — Other Ambulatory Visit: Payer: Self-pay | Admitting: Adult Health

## 2019-02-23 ENCOUNTER — Ambulatory Visit: Payer: Medicare Other | Admitting: Adult Health

## 2019-02-23 ENCOUNTER — Encounter: Payer: Self-pay | Admitting: Adult Health

## 2019-02-23 ENCOUNTER — Other Ambulatory Visit: Payer: Self-pay

## 2019-02-23 VITALS — BP 129/88 | HR 99 | Temp 98.0°F | Ht 72.0 in | Wt 246.2 lb

## 2019-02-23 DIAGNOSIS — F988 Other specified behavioral and emotional disorders with onset usually occurring in childhood and adolescence: Secondary | ICD-10-CM

## 2019-02-23 DIAGNOSIS — R413 Other amnesia: Secondary | ICD-10-CM

## 2019-02-23 NOTE — Patient Instructions (Addendum)
Your Plan:  Memory score 24/30 Have Blood work today If your symptoms worsen or you develop new symptoms please let us know.   Thank you for coming to see [place] at [facility]. I hope we have been able to provide you high quality care today.  You may receive a patient satisfaction survey over the next few weeks. We would appreciate your feedback and comments so that we may continue to improve ourselves and the health of our patients.

## 2019-02-23 NOTE — Progress Notes (Signed)
PATIENT: Stephen York DOB: 02/03/1951  REASON FOR VISIT: follow up HISTORY FROM: patient  HISTORY OF PRESENT ILLNESS: Today 02/23/19:  Stephen York is a 68 year old male with a history of ADHD.  He returns today to discuss some issues with his memory.  He reports that he primarily notices that when he walks into a room he may forget what he went and therefore.  He states that typically after several minutes he will remember.  This is also happened when he was on his laptop.  He also states that he has had trouble with remembering names.  His wife does the finances.  He reports that she is always done this.  He denies any trouble driving.  He is able to complete all ADLs independently.  He returns today for an evaluation.    REVIEW OF SYSTEMS: Out of a complete 14 system review of symptoms, the patient complains only of the following symptoms, and all other reviewed systems are negative.  See HPI ALLERGIES: Allergies  Allergen Reactions  . Lipitor [Atorvastatin]     HOME MEDICATIONS: Outpatient Medications Prior to Visit  Medication Sig Dispense Refill  . ALPRAZolam (XANAX) 0.5 MG tablet Take 0.5 mg by mouth as needed.     Marland Kitchen. amLODipine (NORVASC) 10 MG tablet Take 10 mg by mouth daily.    Marland Kitchen. aspirin 81 MG chewable tablet Chew 81 mg by mouth daily.    Marland Kitchen. atorvastatin (LIPITOR) 10 MG tablet Take 10 mg by mouth daily.    . cholecalciferol (VITAMIN D) 1000 UNITS tablet Take 2,000 Units by mouth daily.     Marland Kitchen. co-enzyme Q-10 50 MG capsule Take 100 mg by mouth daily.    Marland Kitchen. docusate sodium (COLACE) 100 MG capsule Take 100 mg by mouth daily.    . fenofibrate 160 MG tablet Take 160 mg by mouth daily.    . hydrochlorothiazide (HYDRODIURIL) 25 MG tablet Take 25 mg by mouth daily.    Marland Kitchen. HYDROcodone-acetaminophen (NORCO) 7.5-325 MG tablet Take 2 tablets by mouth daily.     Marland Kitchen. lisinopril (PRINIVIL,ZESTRIL) 40 MG tablet Take 40 mg by mouth daily.    . meloxicam (MOBIC) 15 MG tablet Take 15 mg by  mouth daily.    . methocarbamol (ROBAXIN) 500 MG tablet TAKE 1 TABLET TWICE A DAY AS NEEDED FOR SPASMS 20 tablet 0  . methylphenidate (RITALIN) 10 MG tablet TAKE 1 TABLET DAILY 30 tablet 0  . Omega-3 Fatty Acids (FISH OIL PO) Take 1,000 mg by mouth daily.    . predniSONE (DELTASONE) 20 MG tablet Take 2 tablets (40 mg total) by mouth daily. 10 tablet 0  . sertraline (ZOLOFT) 100 MG tablet     . tiZANidine (ZANAFLEX) 4 MG tablet Take 4 mg by mouth daily.    Marland Kitchen. UNABLE TO FIND Take 1 tablet by mouth daily. Med Name: Equate Heartburn    . valACYclovir (VALTREX) 1000 MG tablet Take 1 tablet (1,000 mg total) by mouth 3 (three) times daily. 21 tablet 0  . vitamin E 1000 UNIT capsule Take 400 Units by mouth daily.     . ranitidine (ZANTAC) 150 MG tablet Take 150 mg by mouth 2 (two) times daily.     No facility-administered medications prior to visit.     PAST MEDICAL HISTORY: Past Medical History:  Diagnosis Date  . ADD (attention deficit hyperactivity disorder, inattentive type)    e  . Chronic sinusitis   . Degenerative disk disease   . Dizziness   .  Hyperlipidemia   . Kidney stones october 2012   passed it  . Knee problem    left knee  . Obesity   . PTSD (post-traumatic stress disorder)     PAST SURGICAL HISTORY: Past Surgical History:  Procedure Laterality Date  . back surgeries     3    FAMILY HISTORY: History reviewed. No pertinent family history.  SOCIAL HISTORY: Social History   Socioeconomic History  . Marital status: Married    Spouse name: Curt Bears  . Number of children: 2  . Years of education: 51  . Highest education level: Not on file  Occupational History  . Occupation: retired  Scientific laboratory technician  . Financial resource strain: Not on file  . Food insecurity    Worry: Not on file    Inability: Not on file  . Transportation needs    Medical: Not on file    Non-medical: Not on file  Tobacco Use  . Smoking status: Former Smoker    Quit date: 09/29/1992    Years  since quitting: 26.4  . Smokeless tobacco: Never Used  Substance and Sexual Activity  . Alcohol use: No    Alcohol/week: 0.0 standard drinks    Comment: quit in 1981  . Drug use: No  . Sexual activity: Never  Lifestyle  . Physical activity    Days per week: Not on file    Minutes per session: Not on file  . Stress: Not on file  Relationships  . Social Herbalist on phone: Not on file    Gets together: Not on file    Attends religious service: Not on file    Active member of club or organization: Not on file    Attends meetings of clubs or organizations: Not on file    Relationship status: Not on file  . Intimate partner violence    Fear of current or ex partner: Not on file    Emotionally abused: Not on file    Physically abused: Not on file    Forced sexual activity: Not on file  Other Topics Concern  . Not on file  Social History Narrative   Caffeine 3 cups in am, Married, 2 kids. Retired.        PHYSICAL EXAM  Vitals:   02/23/19 1305  BP: 129/88  Pulse: 99  Temp: 98 F (36.7 C)  TempSrc: Oral  Weight: 246 lb 3.2 oz (111.7 kg)  Height: 6' (1.829 m)   Body mass index is 33.39 kg/m.   MMSE - Mini Mental State Exam 02/23/2019  Not completed: (No Data)  Orientation to time 5  Orientation to Place 4  Registration 3  Attention/ Calculation 0  Recall 3  Language- name 2 objects 2  Language- repeat 1  Language- follow 3 step command 3  Language- read & follow direction 1  Write a sentence 1  Copy design 1  Total score 24     Generalized: Well developed, in no acute distress   Neurological examination  Mentation: Alert oriented to time, place, history taking. Follows all commands speech and language fluent Cranial nerve II-XII: Pupils were equal round reactive to light. Extraocular movements were full, visual field were full on confrontational test. Head turning and shoulder shrug  were normal and symmetric. Motor: The motor testing reveals 5  over 5 strength of all 4 extremities. Good symmetric motor tone is noted throughout.  Sensory: Sensory testing is intact to soft touch on all 4 extremities.  No evidence of extinction is noted.  Coordination: Cerebellar testing reveals good finger-nose-finger and heel-to-shin bilaterally.  Gait and station: Gait is normal.  Reflexes: Deep tendon reflexes are symmetric and normal bilaterally.   DIAGNOSTIC DATA (LABS, IMAGING, TESTING) - I reviewed patient records, labs, notes, testing and imaging myself where available.  No results found for: WBC, HGB, HCT, MCV, PLT    Component Value Date/Time   NA 139 08/31/2018 1017   K 3.9 08/31/2018 1017   CL 107 08/31/2018 1017   CO2 24 08/31/2018 1017   GLUCOSE 112 (H) 08/31/2018 1017   BUN 13 08/31/2018 1017   CREATININE 0.89 08/31/2018 1017   CALCIUM 9.6 08/31/2018 1017   GFRNONAA >60 08/31/2018 1017   GFRAA >60 08/31/2018 1017   No results found for: CHOL, HDL, LDLCALC, LDLDIRECT, TRIG, CHOLHDL No results found for: MLJQ4B No results found for: VITAMINB12 No results found for: TSH    ASSESSMENT AND PLAN 68 y.o. year old male  has a past medical history of ADD (attention deficit hyperactivity disorder, inattentive type), Chronic sinusitis, Degenerative disk disease, Dizziness, Hyperlipidemia, Kidney stones (october 2012), Knee problem, Obesity, and PTSD (post-traumatic stress disorder). here with :  1.  Mild memory disturbance 2.  ADHD  The patient's MMSE today was 24 out of 30.  He only missed the calculation portion of the test.  The patient also reports that he recently had blood work through the [place].  He will have this sent to our office.  We will hold off on an MRI of the brain at this time.  His physical exam was relatively unremarkable.  I will send the patient for neuropsychological testing for memory evaluation.  He is advised that if his mood symptoms worsen or he develops new symptoms he should let us know.  He will follow-up in 6  months or sooner if needed.    Butch Penny, MSN, NP-C 02/23/2019, 1:17 PM [facility] [address] [place], [place] [address] [phone]

## 2019-03-10 ENCOUNTER — Other Ambulatory Visit: Payer: Self-pay | Admitting: Adult Health

## 2019-03-10 ENCOUNTER — Telehealth: Payer: Self-pay | Admitting: Adult Health

## 2019-03-10 NOTE — Telephone Encounter (Signed)
I reviewed lab work from [place].- lab work unremarkable. Please make patient aware that I have reviewed these.

## 2019-03-11 NOTE — Telephone Encounter (Signed)
I called and relayed to wife (ok per DPR) that lab results unremarkable per MM/NP (she received from the [place]).  She would relay to her husband, pt.

## 2019-03-31 ENCOUNTER — Encounter: Payer: Self-pay | Admitting: Gastroenterology

## 2019-04-07 ENCOUNTER — Other Ambulatory Visit: Payer: Self-pay | Admitting: Adult Health

## 2019-04-12 NOTE — Telephone Encounter (Signed)
Called wife and apologized for delay in Ritalin refill.  Advised her the refill has been sent in. She stated patient has gone to pharmacy to pick up; he took last pill today. She  verbalized understanding, appreciation.

## 2019-04-12 NOTE — Telephone Encounter (Addendum)
Phone rep checked office voicemail; pt left message asking why the refill for his medication has yet to be filled.  Please call, this voicemail was left @ 1:19pm

## 2019-05-10 ENCOUNTER — Other Ambulatory Visit: Payer: Self-pay | Admitting: Adult Health

## 2019-06-09 ENCOUNTER — Other Ambulatory Visit: Payer: Self-pay | Admitting: Adult Health

## 2019-06-09 NOTE — Telephone Encounter (Signed)
Pt is needing a refill on his methylphenidate (RITALIN) 10 MG tablet sent in to the [facility]

## 2019-07-08 ENCOUNTER — Other Ambulatory Visit: Payer: Self-pay | Admitting: Adult Health

## 2019-07-09 ENCOUNTER — Encounter: Payer: Self-pay | Admitting: Psychology

## 2019-07-09 ENCOUNTER — Encounter: Payer: Medicare Other | Attending: Psychology | Admitting: Psychology

## 2019-07-09 ENCOUNTER — Other Ambulatory Visit: Payer: Self-pay

## 2019-07-09 DIAGNOSIS — R4189 Other symptoms and signs involving cognitive functions and awareness: Secondary | ICD-10-CM

## 2019-07-09 NOTE — Progress Notes (Signed)
Neuropsychological Consultation   Patient:   Stephen York   DOB:   1951/05/16  MR Number:  [id number]  Location:  [facility] FOR PAIN AND [facility] MEDICINE [facility] PHYSICAL MEDICINE AND REHABILITATION [address] [place], [id number] [facility] [place] [address] Dept: [phone]           Date of Service:   07/09/2019  Start Time:   10 AM End Time:   12 PM  Today's visit was a 1 hour in person visit along with 1 hour of record review and report writing.  This was an in person visit that was conducted in my outpatient clinic office with myself and the patient present.  Provider/Observer:  Arley Phenix, Psy.D.       Clinical Neuropsychologist       Billing Code/Service: 59563, [phone]  Chief Complaint:    Stephen York. Keetch is a 69 year old right-handed male who is referred for neuropsychological evaluation by Butch Penny, NP and Melvyn Novas, MD with [facility].  The patient has a prior history of attention deficit disorder as well as significant alcohol and substance abuse that was completely stopped when he was age 69.  The patient reports that he is having increasing difficulties with memory and recall that he reports he started noticing in his late 71s.  The patient continues to take Ritalin for his adult residual attention deficit disorder.  The patient has also had previous medications for anxiety and depression including alprazolam and sertraline but these date back 3 or 4 years ago.  Reason for Service:  Stephen York. Stephen York is a 69 year old right-handed male who is referred for neuropsychological evaluation by Butch Penny, NP and Melvyn Novas, MD with [facility].  The patient has a prior history of attention deficit disorder as well as significant alcohol and substance abuse that was completely stopped when he was age 69.  The patient reports that he is having increasing difficulties with memory and recall  that he reports he started noticing in his late 71s.  The patient continues to take Ritalin for his adult residual attention deficit disorder.  The patient has also had previous medications for anxiety and depression including alprazolam and sertraline but these date back 3 or 4 years ago.  The patient describes his noticing increasing memory difficulties that started in his late 71s.  The patient reports that he has had increasing issues of forgetting people's names and not being able to remember the name of various movies or other day-to-day factual information.  He reports that cueing does help pull up these memories.  The patient reports that he has not had any geographic disorientation and has not had difficulties driving and keeping up with directions.  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  The patient reports that when he was a teenager 1 young adult that he had lots of legal issues and run-ins with the law.  He reports that most of these were rather minor stuff but he had significant issues with attitude and issues with authority.  He reports that these likely go back to his relationship with his father.  The patient reports that he had some legal issues and was arrested on various occasions.  Substance abuse and alcohol abuse were also noted during this time including the use of methamphetamine, marijuana and alcohol.  The patient reports that he completely stopped using any substances  at age 69.  The patient reports that he has been on disability since he was 69 years old following residual physical difficulties and 3 previous back surgeries.  The patient does acknowledge chronic pain symptoms and issues with high blood pressure.  The patient reports that he has significant difficulty with sleep particularly as he has gotten older.  The patient reports that some nights he has significant difficulty and other  nights he will get up to 8 hours of sleep.  The patient reports that he has been told that he snores excessively and his grandson told him that he snores like "Godzilla."  The patient did have a nasal fracture when he was younger and has been diagnosed with a deviated septum.  He has not been evaluated for obstructive sleep apnea.  The patient had an MRI conducted in 2011 due to his experiencing symptoms of dizziness.  There was no indication of acute infarction and white matter was interpreted as normal.  There was no evidence of demyelinating disease.  They were some small hyperintensities in the parietal white matter bilaterally but this was not felt to be clinically significant and may be due to chronic ischemia.  No other issues were noted.  Reliability of Information: The information is derived from 1 hour face-to-face clinical interview with the patient as well as review of available medical records.  Behavioral Observation: KLINE BULTHUIS  presents as a 69 y.o.-year-old Right Caucasian Male who appeared his stated age. his dress was Appropriate and he was Well Groomed and his manners were Appropriate to the situation.  his participation was indicative of Appropriate and Redirectable behaviors.  There were any physical disabilities noted.  he displayed an appropriate level of cooperation and motivation.     Interactions:    Active Appropriate and Redirectable  Attention:   abnormal and attention span appeared shorter than expected for age  Memory:   within normal limits; recent and remote memory intact  Visuo-spatial:  not examined  Speech (Volume):  normal  Speech:   normal; normal  Thought Process:  Coherent and Relevant  Though Content:  WNL; not suicidal and not homicidal  Orientation:   person, place, time/date and situation  Judgment:   Good  Planning:   Good  Affect:    Appropriate  Mood:    Euthymic  Insight:   Good  Intelligence:   normal  Marital  Status/Living: The patient was born in [place] and had 3 siblings.  No major childhood illnesses were noted beyond chickenpox and mumps.  Developmental milestones were reached at the appropriate time.  The patient is married to his wife for 49 years.  Current Employment: The patient is retired.  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  The patient also worked for several years with the city of [place].  Hobbies and interests include collecting [employer] records as well as extensive collection and working with Artist.  The patient was in the [employer] in 1969 and highest rank at discharge was 5N he was honorably discharged.  Substance Use:  No concerns of substance abuse are reported.  The patient has not used any addictive substances or had alcohol since he was age 61.  However, prior to that he had significant substance abuse including alcohol, methamphetamine and marijuana primarily.  The patient also engaged in use of psychedelic drugs including LSD  and other psychedelics.  Education:   The patient completed the 11th grade and reports that he did well in math and history classes and had some difficulty with spelling.  He repeated the first grade.  Extracurricular activities include playing baseball and softball.  Medical History:   Past Medical History:  Diagnosis Date  . ADD (attention deficit hyperactivity disorder, inattentive type)    e  . Chronic sinusitis   . Degenerative disk disease   . Dizziness   . Hyperlipidemia   . Kidney stones october 2012   passed it  . Knee problem    left knee  . Obesity   . PTSD (post-traumatic stress disorder)         Abuse/Trauma History: The patient grew up with a father who is likely alcoholic and had some varying issues with discipline and interaction with the patient.  Psychiatric History:  The patient has a prior  history of substance abuse, behavioral issues from teenager through young adult with legal difficulties and has had times with treatment for depression and anxiety.  He also has significant chronic pain difficulties related to nerve root impingement in his back and degenerative disc disease with 3 prior back surgeries.  Family Med/Psych History: History reviewed. No pertinent family history.  Impression/DX:  Pilar Plate P. Sedlacek is a 69 year old right-handed male who is referred for neuropsychological evaluation by Ward Givens, NP and Larey Seat, MD with [facility].  The patient has a prior history of attention deficit disorder as well as significant alcohol and substance abuse that was completely stopped when he was age 49.  The patient reports that he is having increasing difficulties with memory and recall that he reports he started noticing in his late 87s.  The patient continues to take Ritalin for his adult residual attention deficit disorder.  The patient has also had previous medications for anxiety and depression including alprazolam and sertraline but these date back 3 or 4 years ago.   Disposition/Plan:  We have scheduled the patient for formal neuropsychological testing that will consist of the Wechsler Adult Intelligence Scale-IV as well as the Wechsler Memory Scale for older adults.  We will also consider looking at other measures particularly those related to attention and concentration but will want to see the results of these 2 objective test batteries first.  Diagnosis:    Subjective memory complaints         Electronically Signed   _______________________ Ilean Skill, Psy.D.

## 2019-07-16 ENCOUNTER — Encounter: Payer: Medicare Other | Admitting: Psychology

## 2019-07-30 ENCOUNTER — Encounter: Payer: Self-pay | Admitting: Psychology

## 2019-07-30 ENCOUNTER — Other Ambulatory Visit: Payer: Self-pay

## 2019-07-30 ENCOUNTER — Encounter: Payer: Medicare Other | Attending: Psychology | Admitting: Psychology

## 2019-07-30 DIAGNOSIS — G479 Sleep disorder, unspecified: Secondary | ICD-10-CM | POA: Diagnosis not present

## 2019-07-30 DIAGNOSIS — R413 Other amnesia: Secondary | ICD-10-CM | POA: Diagnosis not present

## 2019-07-30 DIAGNOSIS — F900 Attention-deficit hyperactivity disorder, predominantly inattentive type: Secondary | ICD-10-CM | POA: Insufficient documentation

## 2019-07-30 DIAGNOSIS — F419 Anxiety disorder, unspecified: Secondary | ICD-10-CM | POA: Diagnosis not present

## 2019-07-30 DIAGNOSIS — R4189 Other symptoms and signs involving cognitive functions and awareness: Secondary | ICD-10-CM

## 2019-07-30 DIAGNOSIS — F329 Major depressive disorder, single episode, unspecified: Secondary | ICD-10-CM | POA: Insufficient documentation

## 2019-07-30 DIAGNOSIS — Z79899 Other long term (current) drug therapy: Secondary | ICD-10-CM | POA: Insufficient documentation

## 2019-07-30 DIAGNOSIS — R0683 Snoring: Secondary | ICD-10-CM | POA: Insufficient documentation

## 2019-07-30 NOTE — Progress Notes (Addendum)
The patient arrived on time to his 13:00 testing appointment, which lasted 240 minutes.   Behavioral Observations:  Appearance: Casually and appropriately dressed with good hygiene. Variable eye contact with staring/glaring noted on occasion.  Gait: Ambulated independently without assistance. Speech: Mostly clear, normal rate and tone, with loud volume. Perseverative at times. Some language errors and imprecise speech noted (e.g., close approximations)  Thought process:  Somewhat disorganized, concrete, and tangential. Significant inattention with mild impulsivity noted. Perseverative at times.  Mood/Affect:  Anxious, irritable, somewhat labile. Intense and fluctuating. Interpersonal: Mostly polite but could appear intense and somewhat intimidating at times  Orientation: Oriented x 4 Effort/Motivation: Good   He did not appear to have difficulty seeing or hearing test items but did have some trouble following instructions on some tests (e.g., Similarities, Digit Span, Vocabulary, Symbol Span) and required a significant amount of prompting (e.g. repetitions) to obtain meaningful responses. He became frustrated on questions he did not know or tasks that were more difficult.   Of note, Digit span total score may underestimate true attention capacity as patient forgot instructions for sequencing subtest after practice items and reverted to repeating digits backwards (previous task); this resulted in premature discontinuation. Patient's performance significantly improved on this task when limits were tested.    Tests Administered: . [employer]  . Wechsler Adult Intelligence Scale, 4th Edition (WAIS-IV) . Wechsler Memory Scale, 4th edition, Older Adult Battery (WMS-IV-OA)  Results:   BNT  Total=56/60, T=56, 73%  WAIS-IV   Composite Score Summary  Scale Sum of Scaled Scores Composite Score Percentile Rank 95% Conf. Interval Qualitative Description  Verbal Comprehension 25 VCI 91 27  86-97 Average  Perceptual Reasoning 23 PRI 86 18 80-93 Low Average  Working Memory 11 WMI 74 4 69-82 Borderline  Processing Speed 16 PSI 89 23 82-98 Low Average  Full Scale 75 FSIQ 82 12 78-86 Low Average  General Ability 48 GAI 87 19 82-92 Low Average   Index Level Discrepancy Comparisons  Comparison Score 1 Score 2 Difference Critical Value .05 Significant Difference Y/N Base Rate by Overall Sample  VCI - PRI 91 86 5 8.31 N 36.3  VCI - WMI 91 74 17 9.30 Y 9.4  VCI - PSI 91 89 2 10.19 N 46.2  PRI - WMI 86 74 12 10.18 Y 18.4  PRI - PSI 86 89 -3 11.00 N 43.9  WMI - PSI 74 89 -15 11.76 Y 15.3  FSIQ - GAI 82 87 -5 3.61 Y 17.6   Differences Between Subtest and Overall Mean of Subtest Scores  Subtest Subtest Scaled Score Mean Scaled Score Difference Critical Value .05 Strength or Weakness Base Rate  Block Design 8 7.50 0.50 2.85  >25%  Similarities 8 7.50 0.50 2.82  >25%  Digit Span 4 7.50 -3.50 2.22 W 10%  Matrix Reasoning 7 7.50 -0.50 2.54  >25%  Vocabulary 7 7.50 -0.50 2.03  >25%  Arithmetic 7 7.50 -0.50 2.73  >25%  Symbol Search 9 7.50 1.50 3.42  >25%  Visual Puzzles 8 7.50 0.50 2.71  >25%  Information 10 7.50 2.50 2.19 S 15-25%  Coding 7 7.50 -0.50 2.97  >25%    WMS-IV (Older Adult Battery)  Index Score Summary  Index Sum of Scaled Scores Index Score Percentile Rank 95% Confidence Interval Qualitative Descriptor  Auditory Memory (AMI) 48 112 79 105-118 High Average  Visual Memory (VMI) 20 100 50 95-105 Average  Immediate Memory (IMI) 33 106 66 99-112 Average  Delayed Memory (DMI)  35 109 73 101-116 Average   Primary Subtest Scaled Score Summary  Subtest Domain Raw Score Scaled Score Percentile Rank  Logical Memory I AM 39 13 84  Logical Memory II AM 27 14 91  Verbal Paired Associates I AM 21 10 50  Verbal Paired Associates II AM 7 11 63  Visual Reproduction I VM 30 10 50  Visual Reproduction II VM 19 10 50  Symbol Span VWM 5 4 2    PROCESS SCORE  CONVERSIONS  Auditory Memory Process Score Summary  Process Score Raw Score Scaled Score Percentile Rank Cumulative Percentage (Base Rate)  LM II Recognition 22 - - >75%  VPA II Recognition 29 - - >75%   Visual Memory Process Score Summary  Process Score Raw Score Scaled Score Percentile Rank Cumulative Percentage (Base Rate)  VR II Recognition 4 - - 26-50%    ABILITY-MEMORY ANALYSIS  Ability Score:  VCI: 91 Date of Testing:  WAIS-IV; WMS-IV 2019/07/30  Predicted Difference Method   Index Predicted WMS-IV Index Score Actual WMS-IV Index Score Difference Critical Value  Significant Difference Y/N Base Rate  Auditory Memory 95 112 -17 10.41 Y 10%  Visual Memory 96 100 -4 7.35 N   Immediate Memory 95 106 -11 9.69 Y 20%  Delayed Memory 95 109 -14 12.22 Y 15%  Statistical significance (critical value) at the .01 level.   Contrast Scaled Scores  Score Score 1 Score 2 Contrast Scaled Score  General Ability Index vs. Auditory Memory Index 87 112 14  General Ability Index vs. Visual Memory Index 87 100 12  General Ability Index vs. Immediate Memory Index 87 106 14  General Ability Index vs. Delayed Memory Index 87 109 14  Verbal Comprehension Index vs. Auditory Memory Index 91 112 14  Perceptual Reasoning Index vs. Visual Memory Index 86 100 12  Working Memory Index vs. Auditory Memory Index 74 112 16

## 2019-08-02 ENCOUNTER — Other Ambulatory Visit: Payer: Self-pay | Admitting: *Deleted

## 2019-08-05 MED ORDER — METHYLPHENIDATE HCL 10 MG PO TABS: 10.0000 mg | ORAL_TABLET | Freq: Every day | ORAL | 0 refills | Status: DC

## 2019-08-12 ENCOUNTER — Encounter: Payer: Self-pay | Admitting: Psychology

## 2019-08-12 ENCOUNTER — Other Ambulatory Visit: Payer: Self-pay

## 2019-08-12 ENCOUNTER — Encounter (HOSPITAL_BASED_OUTPATIENT_CLINIC_OR_DEPARTMENT_OTHER): Payer: Medicare Other | Admitting: Psychology

## 2019-08-12 DIAGNOSIS — F900 Attention-deficit hyperactivity disorder, predominantly inattentive type: Secondary | ICD-10-CM | POA: Diagnosis not present

## 2019-08-12 DIAGNOSIS — Z712 Person consulting for explanation of examination or test findings: Secondary | ICD-10-CM | POA: Diagnosis not present

## 2019-08-12 DIAGNOSIS — R4189 Other symptoms and signs involving cognitive functions and awareness: Secondary | ICD-10-CM

## 2019-08-12 NOTE — Progress Notes (Signed)
Neuropsychological Evaluation   Patient:  Stephen York   DOB: 21-Oct-1950  MR Number: [id number]  Location: [facility] PHYSICAL MEDICINE AND REHABILITATION [address] [place], [address] [id number] [facility] [place] [address] Dept: [phone]   Start: 11 AM End: 12 PM  Provider/Observer:     Hershal Coria PsyD  Chief Complaint:      Chief Complaint  Patient presents with  . Memory Loss  . ADHD    Reason For Service:     Stephen York. Stephen York is a 69 year old right-handed male who was referred for neuropsychological evaluation by Butch Penny, NP and Melvyn Novas, MD with [facility].  The patient has a prior history of attention deficit disorder as well as significant alcohol and substance abuse that was completely stopped when he was age 16.  The patient reports that he is having increasing difficulties with memory and recall that he reports he started noticing in his late 56s.  The patient continues to take Ritalin for his adult residual attention deficit disorder.  The patient has also had previous medications for anxiety and depression including alprazolam and sertraline but these date back 3 or 4 years ago.  The patient describes increasing memory difficulties that started in his late 103s.  The patient reports that he has had increasing issues of forgetting people's names and not being able to remember the name of various movies or other day-to-day factual information.  He reports that cueing does help pull up these memories.  The patient reports that he has not had any geographic disorientation and has not had difficulties driving and keeping up with directions.  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  The patient reports that when he was a teenager through young adult hood, that he had lots of legal issues and  run-ins with the law.  He reports that most of these were rather minor stuff but he had significant issues with attitude and issues with authority.  He reports that these likely go back to his relationship with his father.  The patient reports that he had some legal issues and was arrested on various occasions.  Substance abuse and alcohol abuse were also noted during this time including the use of methamphetamine, marijuana and alcohol.  The patient reports that he completely stopped using any substances at age 41.  The patient reports that he has been on disability since he was 69 years old following residual physical difficulties and 3 previous back surgeries.  The patient does acknowledge chronic pain symptoms and issues with high blood pressure.  The patient reports that he has significant difficulty with sleep particularly as he has gotten older.  The patient reports that some nights he has significant difficulty and other nights he will get up to 8 hours of sleep.  The patient reports that he has been told that he snores excessively and his grandson told him that he snores like "Godzilla."  The patient did have a nasal fracture when he was younger and has been diagnosed with a deviated septum.  He has not been evaluated for obstructive sleep apnea.  The patient had an MRI conducted in 2011 due to his experiencing symptoms of dizziness.  There was no indication of acute infarction and white matter was interpreted as normal.  There was no evidence of demyelinating disease.  They were some small  hyperintensities in the parietal white matter bilaterally but this was not felt to be clinically significant and may be due to chronic ischemia.  No other issues were noted.  Behavioral Observations: Appearance:Casually and appropriately dressed with good hygiene. Variable eye contact with staring/glaring noted on occasion.  Gait:Ambulated independently without assistance. Speech:Mostly clear, normal  rate and tone, with loud volume. Perseverative at times. Some language errors and imprecise speech noted (e.g., close approximations)  Thought process: Somewhat disorganized, concrete, and tangential. Significant inattention with mild impulsivity noted. Perseverative at times.  Mood/Affect: Anxious, irritable, somewhat labile. Intense and fluctuating. Interpersonal: Mostly polite but could appear intense and somewhat intimidating at times  Orientation: Oriented x 4 Effort/Motivation: Good   He did not appear to have difficulty seeing or hearing test items but did have some trouble following instructions on some tests (e.g., Similarities, Digit Span, Vocabulary, Symbol Span) and required a significant amount of prompting (e.g. repetitions) to obtain meaningful responses. He became frustrated on questions he did not know or tasks that were more difficult.   Of note, Digit span total score may underestimate true attention capacity as patient forgot instructions for sequencing subtest after practice items and reverted to repeating digits backwards (previous task); this resulted in premature discontinuation. Patient's performance significantly improved on this task when limits were tested.    Tests Administered:  [employer] Naming Test   Wechsler Adult Intelligence Scale, 4th Edition (WAIS-IV)  Wechsler Memory Scale, 4th edition, Older Adult Battery (WMS-IV-OA)  Test Results:   Initially, an estimation was made as to likely range of historical/premorbid intellectual and cognitive functioning.  The patient's educational history, occupational history, military history and social history suggest that the patient has likely historically performed in the average range globally with some areas likely to be in the high average range relative to his on individual skill set.  We will utilize estimations for standard T-scores to be in the range of 100-105 for conservative estimations of historical/premorbid  functioning.   BNT  Total=56/60, T=56, 73%  The patient's expressive language abilities were objectively assessed utilizing the [facility] N[facility] Test which is a challenge/targeted naming measure.  Also, not objective assessment of expressive language was looked at both during the clinical interview as well as during the formal neuropsychological test administration appointments.  On objective testing the patient performed quite well on targeted naming task falling at the 73rd percentile relative to a normative population.  There were also no indications of expressive or receptive language deficits during face-to-face visits with the patient.  WAIS-IV            Composite Score Summary   Scale Sum of Scaled Scores Composite Score Percentile Rank 95% Conf. Interval Qualitative Description  Verbal Comprehension 25 VCI 91 27 86-97 Average  Perceptual Reasoning 23 PRI 86 18 80-93 Low Average  Working Memory 11 WMI 74 4 69-82 Borderline  Processing Speed 16 PSI 89 23 82-98 Low Average  Full Scale 75 FSIQ 82 12 78-86 Low Average  General Ability 48 GAI 87 19 82-92 Low Average    The patient was administered the Wechsler Adult Intelligence Scale-IV to provide a broad range of well normed measures across a extensive range of cognitive and neuropsychological domains.  Full-scale IQ scores often are impacted by acute changes and therefore should not be used as a measure of historical long-term intellectual and cognitive functioning but more of a composite score for current cognitive functioning as the patient is reporting subjective symptoms of changes in neuropsychological/cognitive  functioning.  The patient produced a full-scale IQ score of 82 which falls at the 12 percentile and is in the low average range.  This is significantly below predicted levels based on his education, occupational history and psychosocial variables.  This level of performance would suggest that 1 or more areas of current  cognitive functioning are being impacted.  We also calculated the patient's general abilities index score, which places less emphasis on working memory and information processing speed variables which are more vulnerable to acute changes and issues.  The patient produced a general abilities index score of 87 which falls at the 19th percentile and in the low average range.  The patient produced a verbal comprehension index score of 91 which falls at the 27th percentile and is in the average range.  This is as high as index score and is only slightly below predicted levels.  The patient performed at the lower end of the average range for verbal reasoning abilities and in the average range for his general fund of information.  Lower than expected performance was noted for measures of vocabulary knowledge.  The patient produced a perceptual reasoning index score of 86 which falls at the 18th percentile and is in the low average range.  This is below predicted levels.  Subtest making up this measure suggest that he is performing in the lower end of the average range with regard to visual analysis and organizational skills and visual estimation and judgment abilities.  The patient showed some difficulties with regard to her visual reasoning and problem-solving.  The patient produced a working memory index score of 74 which falls at the 4th percentile and is in the borderline range.  This is significantly impaired.  However, on 1 of these individual measures of this subtest the patient had difficulty remembering the particular instructions for the task which resulted in early termination of the task using standardized administration procedures.  Limits testing did suggest that the patient was performing better than this individual test suggested under the standardized procedures but overall, he did show significant attentional deficits with regard to auditory encoding abilities.  On other measures of auditory  encoding (arithmetic) the patient continued to show mild deficits relative to expected levels of functioning.  The patient produced a processing speed index score of 89 which falls at the 23rd percentile and is in the low average range.  Individual subtest making up this measure do indicate some considerable variability and measures of information processing speed.  On one measure (symbol search) the patient performed in the average range with no indication of any significant deficit.  However, on another measure (coding) the patient showed mild deficits.  This pattern would suggest some issues with visual scanning and visual searching and overall speed of mental operations.    Differences Between Subtest and Overall Mean of Subtest Scores   Subtest Subtest Scaled Score Mean Scaled Score Difference Critical Value .05 Strength or Weakness Base Rate  Block Design 8 7.50 0.50 2.85  >25%  Similarities 8 7.50 0.50 2.82  >25%  Digit Span 4 7.50 -3.50 2.22 W 10%  Matrix Reasoning 7 7.50 -0.50 2.54  >25%  Vocabulary 7 7.50 -0.50 2.03  >25%  Arithmetic 7 7.50 -0.50 2.73  >25%  Symbol Search 9 7.50 1.50 3.42  >25%  Visual Puzzles 8 7.50 0.50 2.71  >25%  Information 10 7.50 2.50 2.19 S 15-25%  Coding 7 7.50 -0.50 2.97  >25%     WMS-IV (Older Adult  Battery)         Index Score Summary   Index Sum of Scaled Scores Index Score Percentile Rank 95% Confidence Interval Qualitative Descriptor  Auditory Memory (AMI) 48 112 79 105-118 High Average  Visual Memory (VMI) 20 100 50 95-105 Average  Immediate Memory (IMI) 33 106 66 99-112 Average  Delayed Memory (DMI) 35 109 73 101-116 Average   The patient was also administered the Wechsler Memory Scale-IV.  This measure breaks down learning and memory into either auditory or visual components and also looks at immediate memory and delayed memory functions.  On the Wechsler Adult Intelligence Scale the patient displayed mild to moderate deficits  with regard to both auditory and on the symbol span measure of the Wechsler Memory Scale the patient showed significant deficits for visual encoding abilities.  There were indications of attentional deficits and lapses of attention throughout the objective testing protocols.  The patient produced an immediate memory index score of 106 which falls at the 66 percentile and is in the average range and is also very consistent with predicted levels of memory functioning based on historical data.  The patient produced a delayed memory index score of 109 which falls at the 73rd percentile and is also in the average range.  Overall, there does not appear to be any significant deficits in either immediate or delayed memory and given the fact that he has difficulties with attentional variables of encoding these are in fact quite good scores.  The patient produced an auditory memory index score of 112 which falls at the 79th percentile and is in the high average range.  This performance is in fact his best area performance on all measures assessed.  The patient's visual memory index score was 100 which falls at the 50th percentile and is in the average range.  Primary subtest scores for verbal learning both story and list were well within normal limits with no indication of any deficits for either immediate or delayed recall for story learning task.  Visual learning measures both immediate and delayed were also well within normal limits.  The patient also did well on recognition formats for both visual and verbal information with performances for recognition task consistent with his free recall of information.         Primary Subtest Scaled Score Summary   Subtest Domain Raw Score Scaled Score Percentile Rank  Logical Memory I AM 39 13 84  Logical Memory II AM 27 14 91  Verbal [facility] I AM 21 10 50  Verbal [facility] II AM 7 11 63  Visual Reproduction I VM 30 10 50  Visual Reproduction II VM  19 10 50  Symbol Span VWM 5 4 2    PROCESS SCORE CONVERSIONS        Auditory Memory Process Score Summary   Process Score Raw Score Scaled Score Percentile Rank Cumulative Percentage (Base Rate)  LM II Recognition 22 - - >75%  VPA II Recognition 29 - - >75%         Visual Memory Process Score Summary   Process Score Raw Score Scaled Score Percentile Rank Cumulative Percentage (Base Rate)  VR II Recognition 4 - - 26-50%    ABILITY-MEMORY ANALYSIS  Ability Score:    VCI: 91 Date of Testing:           WAIS-IV; WMS-IV 2019/07/30           Predicted Difference Method   Index Predicted WMS-IV Index Score Actual  WMS-IV Index Score Difference Critical Value  Significant Difference Y/N Base Rate  Auditory Memory 95 112 -17 10.41 Y 10%  Visual Memory 96 100 -4 7.35 N   Immediate Memory 95 106 -11 9.69 Y 20%  Delayed Memory 95 109 -14 12.22 Y 15%  Statistical significance (critical value) at the .01 level.    We also utilize the abilities-memory analysis where the patient's verbal comprehension index from the Wechsler Adult Intelligence Scale is used to produce a predicted score on various indices from the Wechsler memory scales and this predicted scores then compared against the actual achieved scores.  On immediate, delayed, auditory and visual memory the patient performed better than predicted levels relative to this analysis with auditory memory and delayed memory being his best areas of performance.  The patient also performed equal to or better than predicted levels based on his education occupational history.   Summary of Results:   Overall, the results of the current neuropsychological evaluation do demonstrate that the primary area of deficits that the patient displays have to do with attentional variables and the patient does have a long history of diagnosis of adult residual attention deficit disorder diagnoses.  The patient also has a history of anxiety and  depressive types of symptoms as well.  There were some individual subtest that showed mild difficulties beyond attention and concentration including some measures of visual reasoning and problem-solving and some variability on subtest requiring information processing speed.  However, he often showed performance is within expected limits on other similar variables and therefore attention and grasping instructions efficiently likely played a role in this variability.  However, one of the areas that the patient identified specifically is experiencing worsening performance and subjective symptoms of deficits had to do with memory changes.  On objective memory assessment, the patient showed no indication of any memory deficits for either immediate or delayed memory or separate variables of auditory versus visual memory.  In fact, the patient's auditory memory was his best area of performance on all measures assessed in this thorough objective battery.  There were no indications of expressive or receptive language deficits, no indications of objective findings of memory deficits relative to a normative population, and while global cognitive functioning was below predicted levels there was no consistent pattern of any specific area of deficits beyond some issues with regard to executive functioning.  Impression/Diagnosis:   Overall, the results of the current neuropsychological evaluation are consistent primarily with ongoing issues related to significant attention and concentration deficits and the patterns are consistent with his longstanding diagnosis of attention deficit disorder adult residual type.  There clearly were no indications of any objective findings of memory deficits and while he had variability in subtest performance with some measures of information processing speed and executive functioning weaknesses there was no consistent finding of any clear cognitive deficits beyond those related to attention and  concentration.  As far as recommendations, I do think that a consideration should be made for objective evaluation for obstructive sleep apnea as the patient does describe significant sleep disturbance and a history of excessive snoring.  He does have injuries that may have contributed to airway inefficiencies including a deviated septum.  The patient has a history of anxiety and depression as well and lately has times where he becomes very agitated and frustrated when faced with challenges that are hard for him to adapt to.  These mood changes may also be related to persistent sleep disturbance.  In any event,  the current objective neuropsychological evaluation would best be explained by an ongoing issue with adult residual attention deficit disorder, possible sleep disorder such as obstructive sleep apnea, normal age-related changes in memory and issues with increased concern and frustration over his difficulties leading to heightened awareness of any problematic areas that he perceives.  I will provide feedback regarding the results of the current neuropsychological evaluation.  We will review the results and talk about various interventions that may be achieved.  There is always going to be some degree of clinical adjustment for his issues of attentional deficits in light of his frustration and anxiety/sleep disturbance when consideration of psychostimulant medications are made.  We will certainly have to weigh the pros and cons of any psychostimulant medications in light of his sleep disturbance and anxiety/depressive features.  Diagnosis:    Attention deficit hyperactivity disorder (ADHD), predominantly inattentive type  Subjective memory complaints   Ilean Skill, Psy.D. Neuropsychologist

## 2019-08-25 ENCOUNTER — Other Ambulatory Visit: Payer: Self-pay

## 2019-08-25 ENCOUNTER — Encounter: Payer: Self-pay | Admitting: Adult Health

## 2019-08-25 ENCOUNTER — Ambulatory Visit: Payer: Medicare Other | Admitting: Adult Health

## 2019-08-25 VITALS — BP 133/86 | HR 91 | Temp 97.7°F | Ht 72.0 in | Wt 247.4 lb

## 2019-08-25 DIAGNOSIS — R413 Other amnesia: Secondary | ICD-10-CM | POA: Diagnosis not present

## 2019-08-25 DIAGNOSIS — F988 Other specified behavioral and emotional disorders with onset usually occurring in childhood and adolescence: Secondary | ICD-10-CM | POA: Diagnosis not present

## 2019-08-25 NOTE — Progress Notes (Signed)
PATIENT: ELIZABETH HAFF DOB: 03/11/1951  REASON FOR VISIT: follow up HISTORY FROM: patient  HISTORY OF PRESENT ILLNESS: Today 08/25/19:  Mr. Dittmar is a 69 year old male with a history of ADHD and some memory disturbances.  He returns today for follow-up.  Ritalin continues to work well for him.  He denies any new memory issues.  Continues to live at home with his wife.  Able to complete all ADLs independently.  Operates a motor vehicle without difficulty.  Continues to manage his medications and appointments.  Returns today for an evaluation.  HISTORY 02/23/19:  Mr. Pellegrin is a 69 year old male with a history of ADHD.  He returns today to discuss some issues with his memory.  He reports that he primarily notices that when he walks into a room he may forget what he went and therefore.  He states that typically after several minutes he will remember.  This is also happened when he was on his laptop.  He also states that he has had trouble with remembering names.  His wife does the finances.  He reports that she is always done this.  He denies any trouble driving.  He is able to complete all ADLs independently.  He returns today for an evaluation.    REVIEW OF SYSTEMS: Out of a complete 14 system review of symptoms, the patient complains only of the following symptoms, and all other reviewed systems are negative.  See HPI  ALLERGIES: Allergies  Allergen Reactions  . Lipitor [Atorvastatin]     HOME MEDICATIONS: Outpatient Medications Prior to Visit  Medication Sig Dispense Refill  . ALPRAZolam (XANAX) 0.5 MG tablet Take 0.5 mg by mouth as needed.     Marland Kitchen amLODipine (NORVASC) 10 MG tablet Take 10 mg by mouth daily.    Marland Kitchen aspirin 81 MG chewable tablet Chew 81 mg by mouth daily.    Marland Kitchen atorvastatin (LIPITOR) 10 MG tablet Take 10 mg by mouth daily.    . cholecalciferol (VITAMIN D) 1000 UNITS tablet Take 2,000 Units by mouth daily.     Marland Kitchen co-enzyme Q-10 50 MG capsule Take 100 mg by  mouth daily.    Marland Kitchen docusate sodium (COLACE) 100 MG capsule Take 100 mg by mouth daily.    . fenofibrate 160 MG tablet Take 160 mg by mouth daily.    . hydrochlorothiazide (HYDRODIURIL) 25 MG tablet Take 25 mg by mouth daily.    Marland Kitchen HYDROcodone-acetaminophen (NORCO) 7.5-325 MG tablet Take 2 tablets by mouth daily.     Marland Kitchen lisinopril (PRINIVIL,ZESTRIL) 40 MG tablet Take 40 mg by mouth daily.    . meloxicam (MOBIC) 15 MG tablet Take 15 mg by mouth daily.    . methocarbamol (ROBAXIN) 500 MG tablet TAKE 1 TABLET TWICE A DAY AS NEEDED FOR SPASMS 20 tablet 0  . methylphenidate (RITALIN) 10 MG tablet Take 1 tablet (10 mg total) by mouth daily. 30 tablet 0  . Omega-3 Fatty Acids (FISH OIL PO) Take 1,000 mg by mouth daily.    . sertraline (ZOLOFT) 100 MG tablet     . tiZANidine (ZANAFLEX) 4 MG tablet Take 4 mg by mouth daily.    Marland Kitchen UNABLE TO FIND Take 1 tablet by mouth daily. Med Name: Equate Heartburn    . vitamin E 1000 UNIT capsule Take 400 Units by mouth daily.     . predniSONE (DELTASONE) 20 MG tablet Take 2 tablets (40 mg total) by mouth daily. 10 tablet 0  . valACYclovir (VALTREX) 1000 MG  tablet Take 1 tablet (1,000 mg total) by mouth 3 (three) times daily. 21 tablet 0   No facility-administered medications prior to visit.    PAST MEDICAL HISTORY: Past Medical History:  Diagnosis Date  . ADD (attention deficit hyperactivity disorder, inattentive type)    e  . Chronic sinusitis   . Degenerative disk disease   . Dizziness   . Hyperlipidemia   . Kidney stones october 2012   passed it  . Knee problem    left knee  . Obesity   . PTSD (post-traumatic stress disorder)     PAST SURGICAL HISTORY: Past Surgical History:  Procedure Laterality Date  . back surgeries     3    FAMILY HISTORY: History reviewed. No pertinent family history.  SOCIAL HISTORY: Social History   Socioeconomic History  . Marital status: Married    Spouse name: Samara Deist  . Number of children: 2  . Years of  education: 69  . Highest education level: Not on file  Occupational History  . Occupation: retired  Tobacco Use  . Smoking status: Former Smoker    Quit date: 09/29/1992    Years since quitting: 26.9  . Smokeless tobacco: Never Used  Substance and Sexual Activity  . Alcohol use: No    Alcohol/week: 0.0 standard drinks    Comment: quit in 1981  . Drug use: No  . Sexual activity: Never  Other Topics Concern  . Not on file  Social History Narrative   Caffeine 3 cups in am, Married, 2 kids. Retired.     Social Determinants of Health   Financial Resource Strain:   . Difficulty of Paying Living Expenses: Not on file  Food Insecurity:   . Worried About Programme researcher, broadcasting/film/video in the Last Year: Not on file  . Ran Out of Food in the Last Year: Not on file  Transportation Needs:   . Lack of Transportation (Medical): Not on file  . Lack of Transportation (Non-Medical): Not on file  Physical Activity:   . Days of Exercise per Week: Not on file  . Minutes of Exercise per Session: Not on file  Stress:   . Feeling of Stress : Not on file  Social Connections:   . Frequency of Communication with Friends and Family: Not on file  . Frequency of Social Gatherings with Friends and Family: Not on file  . Attends Religious Services: Not on file  . Active Member of Clubs or Organizations: Not on file  . Attends Banker Meetings: Not on file  . Marital Status: Not on file  Intimate Partner Violence:   . Fear of Current or Ex-Partner: Not on file  . Emotionally Abused: Not on file  . Physically Abused: Not on file  . Sexually Abused: Not on file      PHYSICAL EXAM  Vitals:   08/25/19 1246  BP: 133/86  Pulse: 91  Temp: 97.7 F (36.5 C)  TempSrc: Oral  Weight: 247 lb 6.4 oz (112.2 kg)  Height: 6' (1.829 m)   Body mass index is 33.55 kg/m.   MMSE - Mini Mental State Exam 08/25/2019 02/23/2019  Not completed: (No Data) (No Data)  Orientation to time 5 5  Orientation to Place  4 4  Registration 3 3  Attention/ Calculation 0 0  Recall 3 3  Language- name 2 objects 2 2  Language- repeat 1 1  Language- follow 3 step command 3 3  Language- read & follow direction 1 1  Write a sentence 1 1  Copy design 1 1  Total score 24 24     Generalized: Well developed, in no acute distress   Neurological examination  Mentation: Alert oriented to time, place, history taking. Follows all commands speech and language fluent Cranial nerve II-XII: Pupils were equal round reactive to light. Extraocular movements were full, visual field were full on confrontational test. Head turning and shoulder shrug  were normal and symmetric. Motor: The motor testing reveals 5 over 5 strength of all 4 extremities. Good symmetric motor tone is noted throughout.  Sensory: Sensory testing is intact to soft touch on all 4 extremities. No evidence of extinction is noted.  Coordination: Cerebellar testing reveals good finger-nose-finger and heel-to-shin bilaterally.  Gait and station: Gait is normal.  Reflexes: Deep tendon reflexes are symmetric and normal bilaterally.   DIAGNOSTIC DATA (LABS, IMAGING, TESTING) - I reviewed patient records, labs, notes, testing and imaging myself where available.  No results found for: WBC, HGB, HCT, MCV, PLT    Component Value Date/Time   NA 139 08/31/2018 1017   K 3.9 08/31/2018 1017   CL 107 08/31/2018 1017   CO2 24 08/31/2018 1017   GLUCOSE 112 (H) 08/31/2018 1017   BUN 13 08/31/2018 1017   CREATININE 0.89 08/31/2018 1017   CALCIUM 9.6 08/31/2018 1017   GFRNONAA >60 08/31/2018 1017   GFRAA >60 08/31/2018 1017      ASSESSMENT AND PLAN 69 y.o. year old male  has a past medical history of ADD (attention deficit hyperactivity disorder, inattentive type), Chronic sinusitis, Degenerative disk disease, Dizziness, Hyperlipidemia, Kidney stones (october 2012), Knee problem, Obesity, and PTSD (post-traumatic stress disorder). here with:  1.   ADD  -Continue Ritalin  2.  Memory disturbance  -Memory score is stable -Has an appointment in May with neuropsychologist  Advised if symptoms worsen or he develops new symptoms he should let us know  I spent 15 minutes of face-to-face and non-face-to-face time with patient.  This included previsit chart review, order entry, electronic health record documentation, patient education.  Ward Givens, MSN, NP-C 08/25/2019, 12:59 PM [facility] [address] [place], [place] [address] [phone]

## 2019-08-25 NOTE — Patient Instructions (Signed)
Your Plan:  Continue ritalin Continue to monitor memory If your symptoms worsen or you develop new symptoms please let us know.   Thank you for coming to see [place] at [facility]. I hope we have been able to provide you high quality care today.  You may receive a patient satisfaction survey over the next few weeks. We would appreciate your feedback and comments so that we may continue to improve ourselves and the health of our patients.

## 2019-09-06 ENCOUNTER — Other Ambulatory Visit: Payer: Self-pay | Admitting: Adult Health

## 2019-10-05 ENCOUNTER — Other Ambulatory Visit: Payer: Self-pay | Admitting: Adult Health

## 2019-10-05 MED ORDER — METHYLPHENIDATE HCL 10 MG PO TABS: 10.0000 mg | ORAL_TABLET | Freq: Every day | ORAL | 0 refills | Status: DC

## 2019-10-05 NOTE — Telephone Encounter (Signed)
Pt is calling for his refill on methylphenidate (RITALIN) 10 MG tablet  @[facility] pt asked that it be noted he will pick up from the pharmacy on tomorrow afternoon

## 2019-10-05 NOTE — Addendum Note (Signed)
Addended by: Guy Begin on: 10/05/2019 04:02 PM   Modules accepted: Orders

## 2019-10-21 ENCOUNTER — Encounter: Payer: Medicare Other | Attending: Psychology | Admitting: Psychology

## 2019-10-21 ENCOUNTER — Encounter: Payer: Self-pay | Admitting: Psychology

## 2019-10-21 ENCOUNTER — Other Ambulatory Visit: Payer: Self-pay

## 2019-10-21 DIAGNOSIS — R0683 Snoring: Secondary | ICD-10-CM | POA: Insufficient documentation

## 2019-10-21 DIAGNOSIS — Z79899 Other long term (current) drug therapy: Secondary | ICD-10-CM | POA: Insufficient documentation

## 2019-10-21 DIAGNOSIS — R413 Other amnesia: Secondary | ICD-10-CM | POA: Insufficient documentation

## 2019-10-21 DIAGNOSIS — F900 Attention-deficit hyperactivity disorder, predominantly inattentive type: Secondary | ICD-10-CM | POA: Insufficient documentation

## 2019-10-21 DIAGNOSIS — F419 Anxiety disorder, unspecified: Secondary | ICD-10-CM | POA: Insufficient documentation

## 2019-10-21 DIAGNOSIS — G479 Sleep disorder, unspecified: Secondary | ICD-10-CM | POA: Insufficient documentation

## 2019-10-21 DIAGNOSIS — F329 Major depressive disorder, single episode, unspecified: Secondary | ICD-10-CM | POA: Insufficient documentation

## 2019-10-21 NOTE — Progress Notes (Signed)
Today I provided feedback to the patient regarding the results of the recent neuropsychological evaluation. The appointment lasted 60 minutes.  Below you can find the summary and impressions/diagnoses from that formal evaluation.  The complete report can be found in the patient's medical records dated 08/12/2019 and the full history and initial intake information can be found dated 07/09/2019.   During feedback, we discussed various interventions that may be utilized to cope with his issues of attention in light of his frustration and anxiety/sleep disturbance.  also talked about how hypervigilance and high stress can interfere with encoding and storage of information.    Summary of Results:                        Overall, the results of the current neuropsychological evaluation do demonstrate that the primary area of deficits that the patient displays have to do with attentional variables and the patient does have a long history of diagnosis of adult residual attention deficit disorder diagnoses.  The patient also has a history of anxiety and depressive types of symptoms as well.  There were some individual subtest that showed mild difficulties beyond attention and concentration including some measures of visual reasoning and problem-solving and some variability on subtest requiring information processing speed.  However, he often showed performance is within expected limits on other similar variables and therefore attention and grasping instructions efficiently likely played a role in this variability.  However, one of the areas that the patient identified specifically is experiencing worsening performance and subjective symptoms of deficits had to do with memory changes.  On objective memory assessment, the patient showed no indication of any memory deficits for either immediate or delayed memory or separate variables of auditory versus visual memory.  In fact, the patient's auditory memory was his best area of  performance on all measures assessed in this thorough objective battery.  There were no indications of expressive or receptive language deficits, no indications of objective findings of memory deficits relative to a normative population, and while global cognitive functioning was below predicted levels there was no consistent pattern of any specific area of deficits beyond some issues with regard to executive functioning.  Impression/Diagnosis:                     Overall, the results of the current neuropsychological evaluation are consistent primarily with ongoing issues related to significant attention and concentration deficits and the patterns are consistent with his longstanding diagnosis of attention deficit disorder adult residual type.  There clearly were no indications of any objective findings of memory deficits and while he had variability in subtest performance with some measures of information processing speed and executive functioning weaknesses there was no consistent finding of any clear cognitive deficits beyond those related to attention and concentration.  As far as recommendations, I do think that a consideration should be made for objective evaluation for obstructive sleep apnea as the patient does describe significant sleep disturbance and a history of excessive snoring.  He does have injuries that may have contributed to airway inefficiencies including a deviated septum.  The patient has a history of anxiety and depression as well and lately has times where he becomes very agitated and frustrated when faced with challenges that are hard for him to adapt to.  These mood changes may also be related to persistent sleep disturbance.  In any event, the current objective neuropsychological evaluation would best be explained by  an ongoing issue with adult residual attention deficit disorder, possible sleep disorder such as obstructive sleep apnea, normal age-related changes in memory and  issues with increased concern and frustration over his difficulties leading to heightened awareness of any problematic areas that he perceives.  Diagnosis:                               Attention deficit hyperactivity disorder (ADHD), predominantly inattentive type  Subjective memory complaints   Arley Phenix, Psy.D. Neuropsychologist

## 2019-10-25 ENCOUNTER — Encounter: Payer: Medicare Other | Admitting: Psychology

## 2019-11-03 ENCOUNTER — Other Ambulatory Visit: Payer: Self-pay | Admitting: Adult Health

## 2019-11-04 MED ORDER — METHYLPHENIDATE HCL 10 MG PO TABS: 10.0000 mg | ORAL_TABLET | Freq: Every day | ORAL | 0 refills | Status: DC

## 2019-11-04 NOTE — Addendum Note (Signed)
Addended by: Enedina Finner on: 11/04/2019 04:34 PM   Modules accepted: Orders

## 2019-11-04 NOTE — Telephone Encounter (Signed)
Pt has called asking if he will be able to get this medication ((RITALIN) ) by the end of the day, please call.

## 2019-11-04 NOTE — Telephone Encounter (Signed)
This was just refilled 4/31 per your note? So then it wouldn't be due? Is this correct?

## 2019-11-07 IMAGING — MR MR KNEE*R* W/O CM
5 series · 40 of 40 positions shown · non-contrast
Comparison: None.

CLINICAL DATA: Acute right knee pain for the past 2 weeks after
hearing a popping noise while standing up.

EXAM:
MRI OF THE RIGHT KNEE WITHOUT CONTRAST
TECHNIQUE: Multiplanar, multisequence MR imaging of the knee was performed. No
intravenous contrast was administered.

[Series 3: PD fat-sat · axial · 4.0mm · 0.47mm/px · z∈[-71,+59]mm · 8 of 27 slices shown (1 of 3)]
[im 1/27]
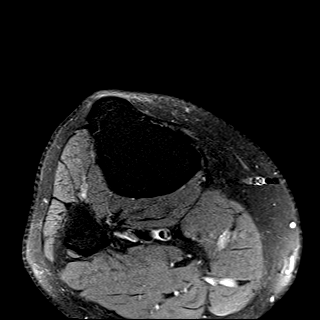
[im 4/27]
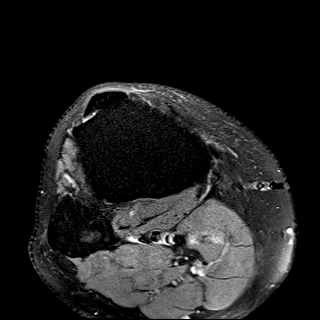
[im 8/27]
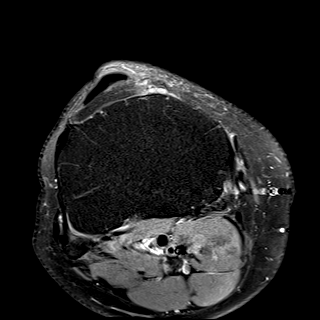
[im 12/27]
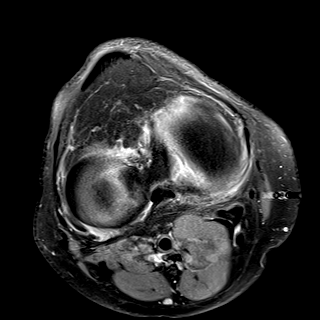
[im 15/27]
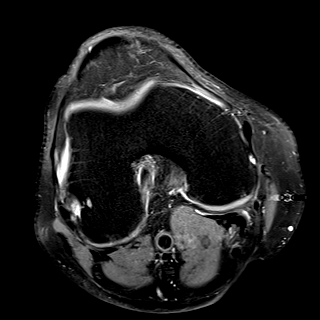
[im 19/27]
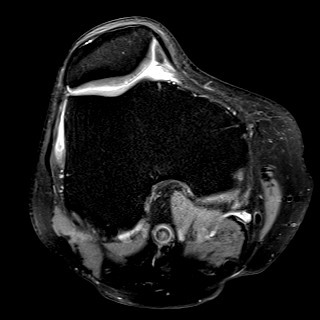
[im 23/27]
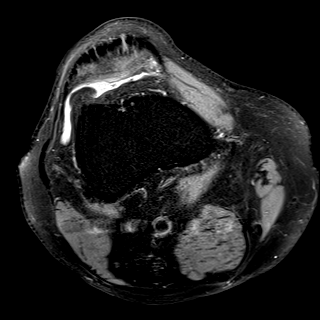
[im 27/27]
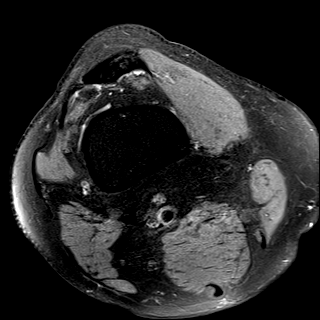

[Series 4: PD fat-sat · coronal · 4.0mm · 0.47mm/px · 8 of 25 slices shown (2 of 3)]
[im 1/25]
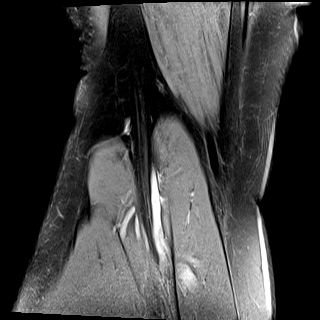
[im 4/25]
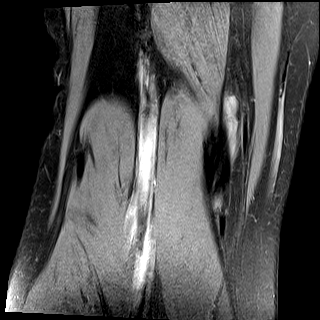
[im 7/25]
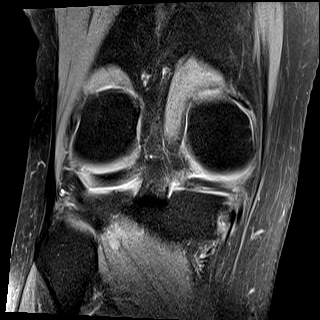
[im 11/25]
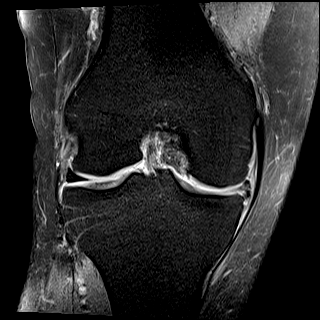
[im 14/25]
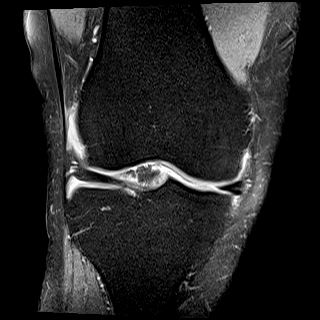
[im 18/25]
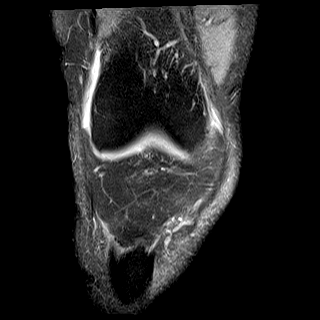
[im 21/25]
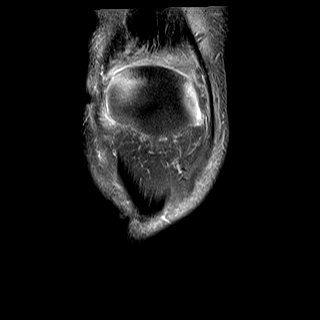
[im 25/25]
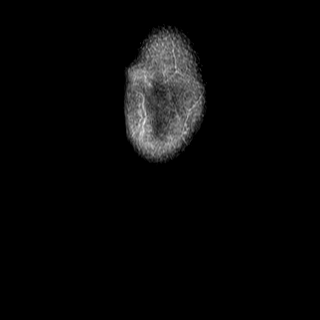

[Series 5: T2 fat-sat · coronal · 4.0mm · 0.47mm/px · 8 of 25 slices shown]
[im 1/25]
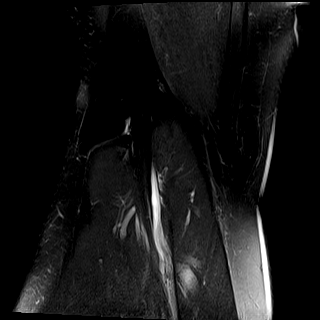
[im 4/25]
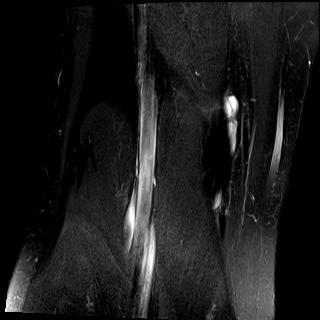
[im 7/25]
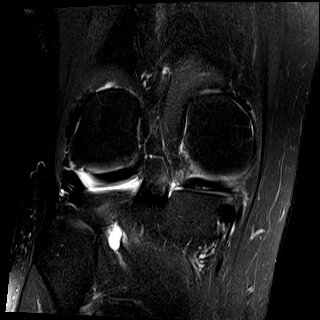
[im 11/25]
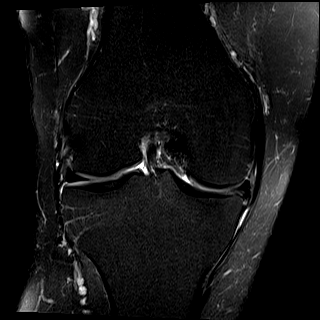
[im 14/25]
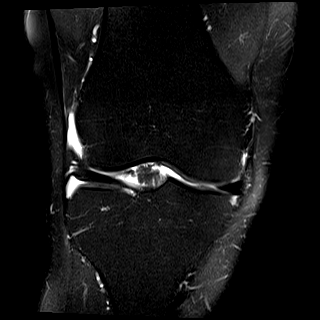
[im 18/25]
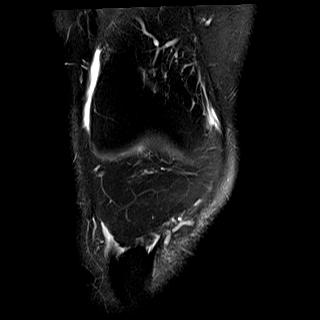
[im 21/25]
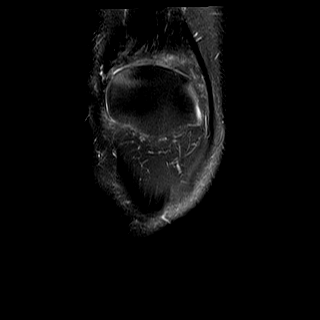
[im 25/25]
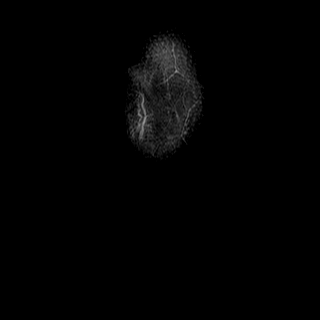

[Series 6: T1 · coronal · 4.0mm · 0.47mm/px · 8 of 25 slices shown]
[im 1/25]
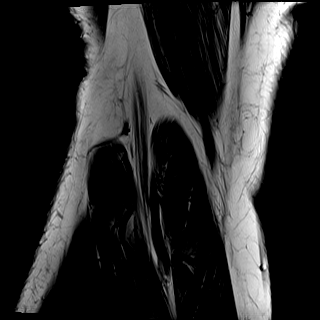
[im 4/25]
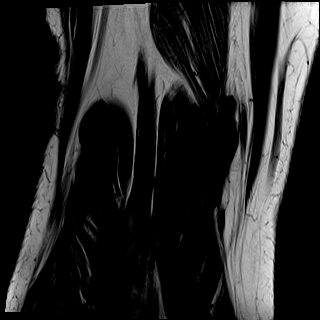
[im 7/25]
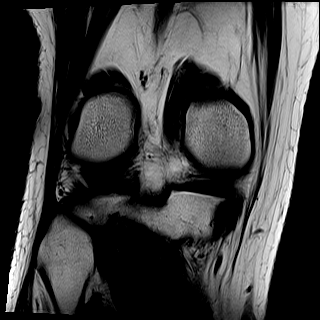
[im 11/25]
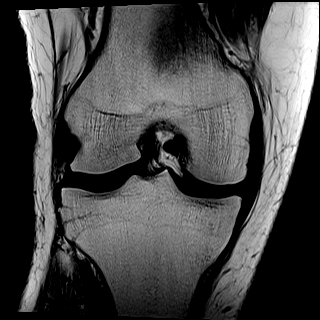
[im 14/25]
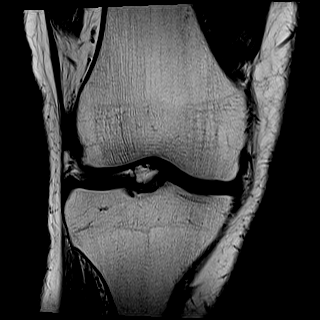
[im 18/25]
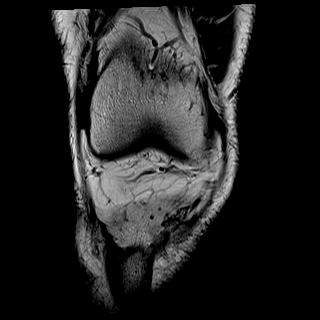
[im 21/25]
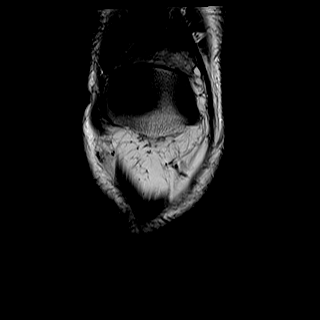
[im 25/25]
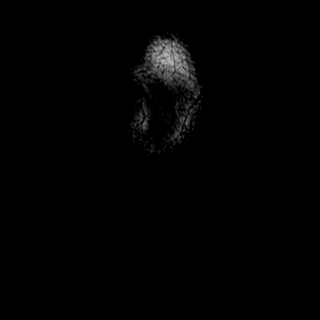

[Series 7: PD fat-sat · sagittal · 4.0mm · 0.47mm/px · 8 of 26 slices shown (3 of 3)]
[im 1/26]
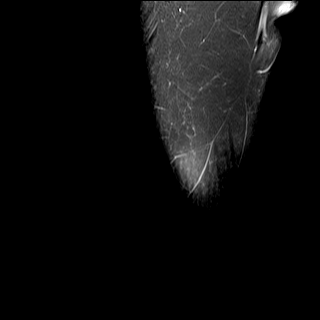
[im 4/26]
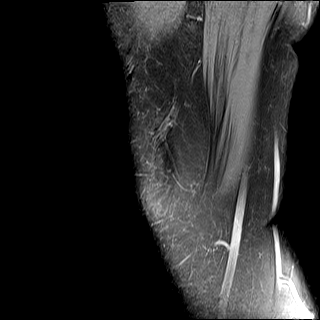
[im 8/26]
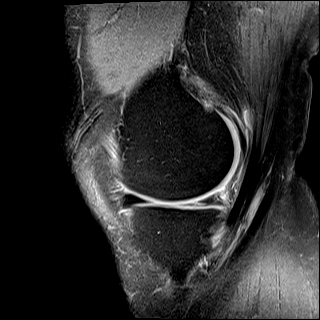
[im 11/26]
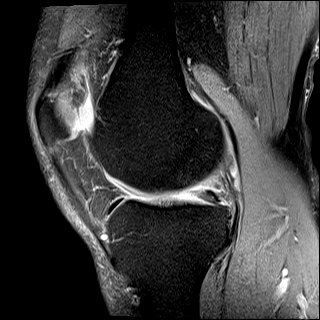
[im 15/26]
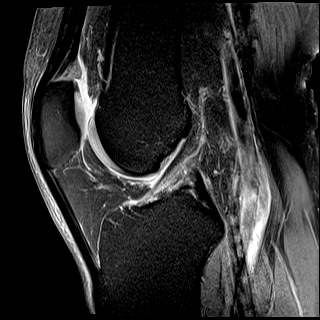
[im 18/26]
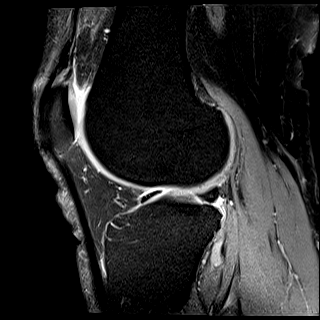
[im 22/26]
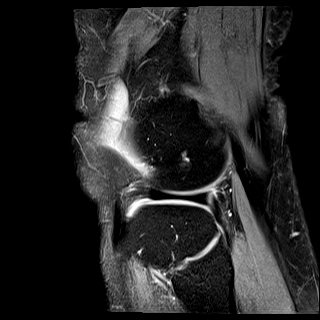
[im 26/26]
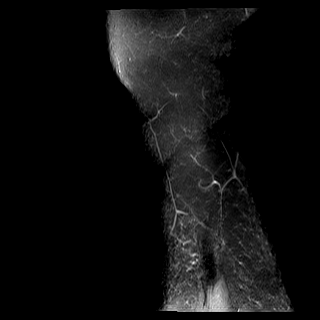

[40 of 40 positions shown; findings below may reference images not displayed]

FINDINGS: MENISCI

Medial meniscus: Complex tear of the posterior horn with radial and
horizontal components, which extend into the posterior body.

Lateral meniscus:  Intact.

LIGAMENTS

Cruciates:  Intact ACL and PCL.

Collaterals: Medial collateral ligament is intact. Lateral
collateral ligament complex is intact.

CARTILAGE

Patellofemoral: Superficial irregularity and fissuring over the
patellar apex and lateral facet.

Medial: Small area of partial-thickness cartilage loss over the
mesial aspect of the medial femoral condyle. Small area of
full-thickness fissuring over the posterior nonweightbearing medial
femoral condyle with trace underlying subchondral marrow edema.

Lateral: Superficial irregularity and fissuring over the central
lateral tibial plateau. Small area of full-thickness fissuring over
the posterior nonweightbearing lateral femoral condyle with trace
underlying subchondral marrow edema.

Joint: Trace joint effusion. Normal Hoffa's fat. No plical
thickening.

Popliteal Fossa:  Trace Baker cyst.  Intact popliteus tendon.

Extensor Mechanism: Intact quadriceps tendon and patellar tendon.
Intact medial and lateral patellar retinaculum. Intact MPFL.

Bones: Reactive cystic change versus small ganglion cyst in the
lateral femoral condyle near the popliteus attachment. No fracture
or dislocation.

Other: None.
IMPRESSION: 1. Complex tear of the medial meniscus posterior horn and body with
radial and horizontal components.
2. Minimal tricompartmental cartilage abnormalities as described
above.
3. Trace joint effusion and Baker cyst.

## 2019-12-01 ENCOUNTER — Other Ambulatory Visit: Payer: Self-pay | Admitting: Adult Health

## 2019-12-02 NOTE — Telephone Encounter (Signed)
Drug registry checked. Last fill 11/04/2019.

## 2019-12-31 ENCOUNTER — Other Ambulatory Visit: Payer: Self-pay | Admitting: Adult Health

## 2020-01-04 NOTE — Telephone Encounter (Signed)
Pt is due for a refill on ritalin. Pt is up to date on his appts. [place] Controlled Substance Registry checked and it is appropriate.

## 2020-02-02 ENCOUNTER — Other Ambulatory Visit: Payer: Self-pay | Admitting: Adult Health

## 2020-02-02 NOTE — Telephone Encounter (Signed)
Voicemail, 3:17p:  Patient needs a refill on his Ritalin.

## 2020-02-03 NOTE — Telephone Encounter (Signed)
Last seen 08-25-19. Next appt 03-02-20 w/ MM/NP.

## 2020-02-26 ENCOUNTER — Other Ambulatory Visit: Payer: Self-pay | Admitting: Adult Health

## 2020-03-01 NOTE — Telephone Encounter (Signed)
Per [place] registry, last filled on 02/03/2020 Methylphenidate 10 Mg Tablet #30 tablets for 30 day supply. Prescribed by MM NP. Will send to provider to refill on or after 03/05/20.

## 2020-03-02 ENCOUNTER — Other Ambulatory Visit: Payer: Self-pay

## 2020-03-02 ENCOUNTER — Ambulatory Visit: Payer: Medicare Other | Admitting: Adult Health

## 2020-03-02 ENCOUNTER — Encounter: Payer: Self-pay | Admitting: Adult Health

## 2020-03-02 VITALS — BP 163/103 | HR 82 | Ht 72.0 in | Wt 250.0 lb

## 2020-03-02 DIAGNOSIS — R413 Other amnesia: Secondary | ICD-10-CM | POA: Diagnosis not present

## 2020-03-02 DIAGNOSIS — F988 Other specified behavioral and emotional disorders with onset usually occurring in childhood and adolescence: Secondary | ICD-10-CM

## 2020-03-02 NOTE — Progress Notes (Signed)
PATIENT: Stephen York DOB: 01-Jul-1950  REASON FOR VISIT: follow up HISTORY FROM: patient  HISTORY OF PRESENT ILLNESS: Today 03/02/20:  [name] is a 69 year old male with a history of ADHD and memory disturbance.  He returns today for follow-up.  He reports that Ritalin does help him a lot.  He lives at home with his wife.  He feels that his memory has remained stable.  He denies any difficulty driving.  Able to complete all ADLs independently.  His wife manages the finances.  She has always done this.  He is able to manage his own medications and appointments.  He returns today for an evaluation.  HISTORY 08/25/19:  [name] is a 69 year old male with a history of ADHD and some memory disturbances.  He returns today for follow-up.  Ritalin continues to work well for him.  He denies any new memory issues.  Continues to live at home with his wife.  Able to complete all ADLs independently.  Operates a motor vehicle without difficulty.  Continues to manage his medications and appointments.  Returns today for an evaluation.  REVIEW OF SYSTEMS: Out of a complete 14 system review of symptoms, the patient complains only of the following symptoms, and all other reviewed systems are negative.  See HPI  ALLERGIES: Allergies  Allergen Reactions  . Lipitor [Atorvastatin]     HOME MEDICATIONS: Outpatient Medications Prior to Visit  Medication Sig Dispense Refill  . ALPRAZolam (XANAX) 0.5 MG tablet Take 0.5 mg by mouth as needed.     Marland Kitchen amLODipine (NORVASC) 10 MG tablet Take 10 mg by mouth daily.    Marland Kitchen aspirin 81 MG chewable tablet Chew 81 mg by mouth daily.    Marland Kitchen atorvastatin (LIPITOR) 10 MG tablet Take 10 mg by mouth daily.    . cholecalciferol (VITAMIN D) 1000 UNITS tablet Take 2,000 Units by mouth daily.     Marland Kitchen co-enzyme Q-10 50 MG capsule Take 100 mg by mouth daily.    Marland Kitchen docusate sodium (COLACE) 100 MG capsule Take 100 mg by mouth daily.    . fenofibrate 160 MG tablet Take 160  mg by mouth daily.    . hydrochlorothiazide (HYDRODIURIL) 25 MG tablet Take 25 mg by mouth daily.    Marland Kitchen HYDROcodone-acetaminophen (NORCO) 7.5-325 MG tablet Take 2 tablets by mouth daily.     Marland Kitchen lisinopril (PRINIVIL,ZESTRIL) 40 MG tablet Take 40 mg by mouth daily.    . meloxicam (MOBIC) 15 MG tablet Take 15 mg by mouth daily.    . methocarbamol (ROBAXIN) 500 MG tablet TAKE 1 TABLET TWICE A DAY AS NEEDED FOR SPASMS 20 tablet 0  . methylphenidate (RITALIN) 10 MG tablet TAKE 1 TABLET DAILY 30 tablet 0  . Omega-3 Fatty Acids (FISH OIL PO) Take 1,000 mg by mouth daily.    . sertraline (ZOLOFT) 100 MG tablet     . tiZANidine (ZANAFLEX) 4 MG tablet Take 4 mg by mouth daily.    Marland Kitchen UNABLE TO FIND Take 1 tablet by mouth daily. Med Name: Equate Heartburn    . vitamin E 1000 UNIT capsule Take 400 Units by mouth daily.      No facility-administered medications prior to visit.    PAST MEDICAL HISTORY: Past Medical History:  Diagnosis Date  . ADD (attention deficit hyperactivity disorder, inattentive type)    e  . Chronic sinusitis   . Degenerative disk disease   . Dizziness   . Hyperlipidemia   . Kidney stones october  2012   passed it  . Knee problem    left knee  . Obesity   . PTSD (post-traumatic stress disorder)     PAST SURGICAL HISTORY: Past Surgical History:  Procedure Laterality Date  . back surgeries     3    FAMILY HISTORY: No family history on file.  SOCIAL HISTORY: Social History   Socioeconomic History  . Marital status: Married    Spouse name: Samara Deist  . Number of children: 2  . Years of education: 43  . Highest education level: Not on file  Occupational History  . Occupation: retired  Tobacco Use  . Smoking status: Former Smoker    Quit date: 09/29/1992    Years since quitting: 27.4  . Smokeless tobacco: Never Used  Substance and Sexual Activity  . Alcohol use: No    Alcohol/week: 0.0 standard drinks    Comment: quit in 1981  . Drug use: No  . Sexual  activity: Never  Other Topics Concern  . Not on file  Social History Narrative   Caffeine 3 cups in am, Married, 2 kids. Retired.     Social Determinants of Health   Financial Resource Strain:   . Difficulty of Paying Living Expenses: Not on file  Food Insecurity:   . Worried About Programme researcher, broadcasting/film/video in the Last Year: Not on file  . Ran Out of Food in the Last Year: Not on file  Transportation Needs:   . Lack of Transportation (Medical): Not on file  . Lack of Transportation (Non-Medical): Not on file  Physical Activity:   . Days of Exercise per Week: Not on file  . Minutes of Exercise per Session: Not on file  Stress:   . Feeling of Stress : Not on file  Social Connections:   . Frequency of Communication with Friends and Family: Not on file  . Frequency of Social Gatherings with Friends and Family: Not on file  . Attends Religious Services: Not on file  . Active Member of Clubs or Organizations: Not on file  . Attends Banker Meetings: Not on file  . Marital Status: Not on file  Intimate Partner Violence:   . Fear of Current or Ex-Partner: Not on file  . Emotionally Abused: Not on file  . Physically Abused: Not on file  . Sexually Abused: Not on file      PHYSICAL EXAM  Vitals:   03/02/20 1304 03/02/20 1307 03/02/20 1311  BP: (!) 156/107 (!) 158/102 (!) 151/107  Pulse: 87 89   Weight: 250 lb (113.4 kg)    Height: 6' (1.829 m)     Body mass index is 33.91 kg/m.   MMSE - Mini Mental State Exam 03/02/2020 08/25/2019 02/23/2019  Not completed: - (No Data) (No Data)  Orientation to time 5 5 5   Orientation to Place 5 4 4   Registration 3 3 3   Attention/ Calculation 5 0 0  Recall 3 3 3   Language- name 2 objects 2 2 2   Language- repeat 1 1 1   Language- follow 3 step command 3 3 3   Language- read & follow direction 1 1 1   Write a sentence 1 1 1   Copy design 1 1 1   Total score 30 24 24      Generalized: Well developed, in no acute distress    Neurological examination  Mentation: Alert oriented to time, place, history taking. Follows all commands speech and language fluent Cranial nerve II-XII: Pupils were equal round reactive to  light. Extraocular movements were full, visual field were full on confrontational test . Head turning and shoulder shrug  were normal and symmetric. Motor: The motor testing reveals 5 over 5 strength of all 4 extremities. Good symmetric motor tone is noted throughout.  Sensory: Sensory testing is intact to soft touch on all 4 extremities. No evidence of extinction is noted.  Coordination: Cerebellar testing reveals good finger-nose-finger and heel-to-shin bilaterally.  Gait and station: Gait is normal.    DIAGNOSTIC DATA (LABS, IMAGING, TESTING) - I reviewed patient records, labs, notes, testing and imaging myself where available.      Component Value Date/Time   NA 139 08/31/2018 1017   K 3.9 08/31/2018 1017   CL 107 08/31/2018 1017   CO2 24 08/31/2018 1017   GLUCOSE 112 (H) 08/31/2018 1017   BUN 13 08/31/2018 1017   CREATININE 0.89 08/31/2018 1017   CALCIUM 9.6 08/31/2018 1017   GFRNONAA >60 08/31/2018 1017   GFRAA >60 08/31/2018 1017      ASSESSMENT AND PLAN 69 y.o. year old male  has a past medical history of ADD (attention deficit hyperactivity disorder, inattentive type), Chronic sinusitis, Degenerative disk disease, Dizziness, Hyperlipidemia, Kidney stones (october 2012), Knee problem, Obesity, and PTSD (post-traumatic stress disorder). here with:  1: ADD   Continue Ritalin  Blood pressure elevated today-patient plans to discuss with his PCP  Will need to monitor due to the patient being on Ritalin  2.  Memory disturbance   Memory score stable  Continue to monitor  Follow-up in 6 months or sooner if needed   Butch Penny, MSN, NP-C 03/02/2020, 1:33 PM [facility] [address] [place], [place] [address] [phone]

## 2020-03-31 ENCOUNTER — Other Ambulatory Visit: Payer: Self-pay | Admitting: Adult Health

## 2020-05-02 ENCOUNTER — Other Ambulatory Visit: Payer: Self-pay | Admitting: Adult Health

## 2020-05-31 ENCOUNTER — Other Ambulatory Visit: Payer: Self-pay | Admitting: Adult Health

## 2020-05-31 DIAGNOSIS — F988 Other specified behavioral and emotional disorders with onset usually occurring in childhood and adolescence: Secondary | ICD-10-CM

## 2020-06-24 HISTORY — PX: TRIGGER FINGER RELEASE: SHX641

## 2020-07-01 ENCOUNTER — Other Ambulatory Visit: Payer: Self-pay | Admitting: Adult Health

## 2020-07-01 DIAGNOSIS — F988 Other specified behavioral and emotional disorders with onset usually occurring in childhood and adolescence: Secondary | ICD-10-CM

## 2020-07-04 ENCOUNTER — Telehealth: Payer: Self-pay | Admitting: Adult Health

## 2020-07-04 ENCOUNTER — Other Ambulatory Visit: Payer: Self-pay | Admitting: Adult Health

## 2020-07-04 DIAGNOSIS — F988 Other specified behavioral and emotional disorders with onset usually occurring in childhood and adolescence: Secondary | ICD-10-CM

## 2020-07-04 MED ORDER — METHYLPHENIDATE HCL 10 MG PO TABS: 10.0000 mg | ORAL_TABLET | Freq: Every day | ORAL | 0 refills | Status: DC

## 2020-07-04 NOTE — Telephone Encounter (Signed)
Per [facility], last fill was on 06/02/20 for #30. His last OV was on 03/02/20 and has a follow up on 08/30/20.

## 2020-07-04 NOTE — Telephone Encounter (Signed)
Pt called to check on status of refill for methylphenidate (RITALIN) 10 MG tablet. Pt is out of medication.

## 2020-07-04 NOTE — Telephone Encounter (Signed)
Pt. is requesting a refill for methylphenidate (RITALIN) 10 MG tablet.  Pharmacy: [facility]

## 2020-07-04 NOTE — Telephone Encounter (Signed)
This was sent to [facility].

## 2020-07-04 NOTE — Addendum Note (Signed)
Addended by: Guy Begin on: 07/04/2020 03:46 PM   Modules accepted: Orders

## 2020-07-17 ENCOUNTER — Other Ambulatory Visit: Payer: Self-pay

## 2020-07-17 ENCOUNTER — Encounter: Payer: Medicare Other | Attending: Psychology | Admitting: Psychology

## 2020-07-17 ENCOUNTER — Encounter: Payer: Self-pay | Admitting: Psychology

## 2020-07-17 DIAGNOSIS — F900 Attention-deficit hyperactivity disorder, predominantly inattentive type: Secondary | ICD-10-CM

## 2020-07-17 DIAGNOSIS — R4189 Other symptoms and signs involving cognitive functions and awareness: Secondary | ICD-10-CM | POA: Diagnosis not present
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
## 2020-08-02 ENCOUNTER — Other Ambulatory Visit: Payer: Self-pay | Admitting: Adult Health

## 2020-08-02 DIAGNOSIS — F988 Other specified behavioral and emotional disorders with onset usually occurring in childhood and adolescence: Secondary | ICD-10-CM

## 2020-08-30 ENCOUNTER — Encounter: Payer: Self-pay | Admitting: Adult Health

## 2020-08-30 ENCOUNTER — Ambulatory Visit: Payer: Medicare Other | Admitting: Adult Health

## 2020-08-30 ENCOUNTER — Other Ambulatory Visit: Payer: Self-pay

## 2020-08-30 VITALS — BP 126/74 | HR 76 | Ht 71.0 in | Wt 262.0 lb

## 2020-08-30 DIAGNOSIS — R413 Other amnesia: Secondary | ICD-10-CM

## 2020-08-30 DIAGNOSIS — F988 Other specified behavioral and emotional disorders with onset usually occurring in childhood and adolescence: Secondary | ICD-10-CM

## 2020-08-30 MED ORDER — METHYLPHENIDATE HCL 10 MG PO TABS: 10.0000 mg | ORAL_TABLET | Freq: Every day | ORAL | 0 refills | Status: DC

## 2020-08-30 NOTE — Addendum Note (Signed)
Addended by: Enedina Finner on: 08/30/2020 03:20 PM   Modules accepted: Orders

## 2020-08-30 NOTE — Patient Instructions (Signed)
Your Plan:  Continue Ritalin If your symptoms worsen or you develop new symptoms please let us know.   Thank you for coming to see us at [facility]. I hope we have been able to provide you high quality care today.  You may receive a patient satisfaction survey over the next few weeks. We would appreciate your feedback and comments so that we may continue to improve ourselves and the health of our patients.  

## 2020-08-30 NOTE — Progress Notes (Signed)
PATIENT: Stephen York DOB: [birth year]  REASON FOR VISIT: follow up HISTORY FROM: patient  HISTORY OF PRESENT ILLNESS: Today 08/30/20:   Stephen York is a 70 year old male with a history of ADHD and memory disturbance he returns today for follow-up.  Reports that Ritalin continues to be beneficial.  He is able to drive without difficulty.  Completes all ADLs independently.  He manages his own medications.  Denies any changes in mood or behavior.  Returns today for an evaluation.  03/02/20 Stephen York is a 70 year old male with a history of ADHD and memory disturbance.  He returns today for follow-up.  He reports that Ritalin does help him a lot.  He lives at home with his wife.  He feels that his memory has remained stable.  He denies any difficulty driving.  Able to complete all ADLs independently.  His wife manages the finances.  She has always done this.  He is able to manage his own medications and appointments.  He returns today for an evaluation.  HISTORY 08/25/19:  Stephen York is a 70 year old male with a history of ADHD and some memory disturbances.  He returns today for follow-up.  Ritalin continues to work well for him.  He denies any new memory issues.  Continues to live at home with his wife.  Able to complete all ADLs independently.  Operates a motor vehicle without difficulty.  Continues to manage his medications and appointments.  Returns today for an evaluation.  REVIEW OF SYSTEMS: Out of a complete 14 system review of symptoms, the patient complains only of the following symptoms, and all other reviewed systems are negative.  See HPI  ALLERGIES: Allergies  Allergen Reactions  . Lipitor [Atorvastatin]     HOME MEDICATIONS: Outpatient Medications Prior to Visit  Medication Sig Dispense Refill  . ALPRAZolam (XANAX) 0.5 MG tablet Take 0.5 mg by mouth as needed.     Marland Kitchen amLODipine (NORVASC) 10 MG tablet Take 10 mg by mouth daily.    Marland Kitchen aspirin 81 MG chewable tablet  Chew 81 mg by mouth daily.    Marland Kitchen atorvastatin (LIPITOR) 10 MG tablet Take 10 mg by mouth daily.    . cholecalciferol (VITAMIN D) 1000 UNITS tablet Take 2,000 Units by mouth daily.     Marland Kitchen co-enzyme Q-10 50 MG capsule Take 100 mg by mouth daily.    Marland Kitchen docusate sodium (COLACE) 100 MG capsule Take 100 mg by mouth daily.    . fenofibrate 160 MG tablet Take 160 mg by mouth daily.    . hydrochlorothiazide (HYDRODIURIL) 25 MG tablet Take 25 mg by mouth daily.    Marland Kitchen HYDROcodone-acetaminophen (NORCO) 7.5-325 MG tablet Take 2 tablets by mouth daily.     Marland Kitchen lisinopril (PRINIVIL,ZESTRIL) 40 MG tablet Take 40 mg by mouth daily.    . meloxicam (MOBIC) 15 MG tablet Take 15 mg by mouth daily.    . methocarbamol (ROBAXIN) 500 MG tablet TAKE 1 TABLET TWICE A DAY AS NEEDED FOR SPASMS 20 tablet 0  . methylphenidate (RITALIN) 10 MG tablet TAKE 1 TABLET DAILY 30 tablet 0  . Omega-3 Fatty Acids (FISH OIL PO) Take 1,000 mg by mouth daily.    . sertraline (ZOLOFT) 100 MG tablet     . tiZANidine (ZANAFLEX) 4 MG tablet Take 4 mg by mouth daily.    Marland Kitchen UNABLE TO FIND Take 1 tablet by mouth daily. Med Name: Equate Heartburn    . vitamin E 1000 UNIT capsule Take 400 Units  by mouth daily.      No facility-administered medications prior to visit.    PAST MEDICAL HISTORY: Past Medical History:  Diagnosis Date  . ADD (attention deficit hyperactivity disorder, inattentive type)    e  . Chronic sinusitis   . Degenerative disk disease   . Dizziness   . Hyperlipidemia   . Kidney stones october 2012   passed it  . Knee problem    left knee  . Obesity   . PTSD (post-traumatic stress disorder)     PAST SURGICAL HISTORY: Past Surgical History:  Procedure Laterality Date  . back surgeries     3    FAMILY HISTORY: No family history on file.  SOCIAL HISTORY: Social History   Socioeconomic History  . Marital status: Married    Spouse name: Samara Deist  . Number of children: 2  . Years of education: 58  . Highest  education level: Not on file  Occupational History  . Occupation: retired  Tobacco Use  . Smoking status: Former Smoker    Quit date: 09/29/1992    Years since quitting: 27.9  . Smokeless tobacco: Never Used  Substance and Sexual Activity  . Alcohol use: No    Alcohol/week: 0.0 standard drinks    Comment: quit in 1981  . Drug use: No  . Sexual activity: Never  Other Topics Concern  . Not on file  Social History Narrative   Caffeine 3 cups in am, Married, 2 kids. Retired.     Social Determinants of Health   Financial Resource Strain: Not on file  Food Insecurity: Not on file  Transportation Needs: Not on file  Physical Activity: Not on file  Stress: Not on file  Social Connections: Not on file  Intimate Partner Violence: Not on file      PHYSICAL EXAM  Vitals:   08/30/20 1334  Weight: 262 lb (118.8 kg)  Height: 5\' 11"  (1.803 m)   Body mass index is 36.54 kg/m.   MMSE - Mini Mental State Exam 03/02/2020 08/25/2019 02/23/2019  Not completed: - (No Data) (No Data)  Orientation to time 5 5 5   Orientation to Place 5 4 4   Registration 3 3 3   Attention/ Calculation 5 0 0  Recall 3 3 3   Language- name 2 objects 2 2 2   Language- repeat 1 1 1   Language- follow 3 step command 3 3 3   Language- read & follow direction 1 1 1   Write a sentence 1 1 1   Copy design 1 1 1   Total score 30 24 24      Generalized: Well developed, in no acute distress   Neurological examination  Mentation: Alert oriented to time, place, history taking. Follows all commands speech and language fluent Cranial nerve II-XII: Pupils were equal round reactive to light. Extraocular movements were full, visual field were full on confrontational test . Head turning and shoulder shrug  were normal and symmetric. Motor: The motor testing reveals 5 over 5 strength of all 4 extremities. Good symmetric motor tone is noted throughout.  Sensory: Sensory testing is intact to soft touch on all 4 extremities. No  evidence of extinction is noted.  Coordination: Cerebellar testing reveals good finger-nose-finger and heel-to-shin bilaterally.  Gait and station: Gait is normal.    DIAGNOSTIC DATA (LABS, IMAGING, TESTING) - I reviewed patient records, labs, notes, testing and imaging myself where available.      Component Value Date/Time   NA 139 08/31/2018 1017   K 3.9 08/31/2018 1017  CL 107 08/31/2018 1017   CO2 24 08/31/2018 1017   GLUCOSE 112 (H) 08/31/2018 1017   BUN 13 08/31/2018 1017   CREATININE 0.89 08/31/2018 1017   CALCIUM 9.6 08/31/2018 1017   GFRNONAA >60 08/31/2018 1017   GFRAA >60 08/31/2018 1017      ASSESSMENT AND PLAN 70 y.o. year old male  has a past medical history of ADD (attention deficit hyperactivity disorder, inattentive type), Chronic sinusitis, Degenerative disk disease, Dizziness, Hyperlipidemia, Kidney stones (october 2012), Knee problem, Obesity, and PTSD (post-traumatic stress disorder). here with:  1: ADD   Continue Ritalin  Blood pressure elevated today-patient plans to discuss with his PCP  Will need to monitor due to the patient being on Ritalin  2.  Memory disturbance   Reports memory is stable. Previous MMSE 30/30  Continue to monitor  I spent 25 minutes of face-to-face and non-face-to-face time with patient.  This included previsit chart review, lab review, study review, order entry, electronic health record documentation, patient education.    Butch Penny, MSN, NP-C 08/30/2020, 1:33 PM [facility] [address], [address] [place], [place] [address] [phone]

## 2020-09-27 ENCOUNTER — Other Ambulatory Visit: Payer: Self-pay | Admitting: Adult Health

## 2020-09-27 DIAGNOSIS — F988 Other specified behavioral and emotional disorders with onset usually occurring in childhood and adolescence: Secondary | ICD-10-CM

## 2020-10-26 ENCOUNTER — Other Ambulatory Visit: Payer: Self-pay | Admitting: Adult Health

## 2020-10-26 DIAGNOSIS — F988 Other specified behavioral and emotional disorders with onset usually occurring in childhood and adolescence: Secondary | ICD-10-CM

## 2020-11-27 ENCOUNTER — Other Ambulatory Visit: Payer: Self-pay | Admitting: Adult Health

## 2020-11-27 DIAGNOSIS — F988 Other specified behavioral and emotional disorders with onset usually occurring in childhood and adolescence: Secondary | ICD-10-CM

## 2020-12-28 ENCOUNTER — Other Ambulatory Visit: Payer: Self-pay | Admitting: Adult Health

## 2020-12-28 DIAGNOSIS — F988 Other specified behavioral and emotional disorders with onset usually occurring in childhood and adolescence: Secondary | ICD-10-CM

## 2020-12-28 NOTE — Telephone Encounter (Signed)
[facility] Verified LR: 11-27-20 Qty: 30 FOR 30 DAYS  Last OV: 08-30-20 Pending appointment:  03-07-21

## 2021-01-02 ENCOUNTER — Ambulatory Visit: Payer: Medicare Other | Admitting: Neurology

## 2021-01-02 ENCOUNTER — Other Ambulatory Visit (INDEPENDENT_AMBULATORY_CARE_PROVIDER_SITE_OTHER): Payer: Self-pay | Admitting: Neurology

## 2021-01-02 ENCOUNTER — Encounter: Payer: Self-pay | Admitting: Neurology

## 2021-01-02 VITALS — BP 108/74 | HR 83 | Ht 72.0 in | Wt 244.5 lb

## 2021-01-02 DIAGNOSIS — I10 Essential (primary) hypertension: Secondary | ICD-10-CM | POA: Diagnosis not present

## 2021-01-02 DIAGNOSIS — Z0289 Encounter for other administrative examinations: Secondary | ICD-10-CM

## 2021-01-02 DIAGNOSIS — Z8709 Personal history of other diseases of the respiratory system: Secondary | ICD-10-CM

## 2021-01-02 DIAGNOSIS — F900 Attention-deficit hyperactivity disorder, predominantly inattentive type: Secondary | ICD-10-CM | POA: Diagnosis not present

## 2021-01-02 DIAGNOSIS — R0683 Snoring: Secondary | ICD-10-CM

## 2021-01-02 MED ORDER — METHYLPHENIDATE HCL 10 MG PO TABS: 10.0000 mg | ORAL_TABLET | Freq: Every day | ORAL | 0 refills | Status: DC

## 2021-01-02 NOTE — Progress Notes (Signed)
SLEEP MEDICINE CLINIC    Provider:  Melvyn Novas, MD  Primary Care Physician:  Clayborn Heron, MD [address] [place] [address]     Referring Provider: No referring provider defined for this encounter.          Chief Complaint according to patient   Patient presents with:     New Patient (Initial Visit)           HISTORY OF PRESENT ILLNESS:  Stephen York is a 70 y.o. year old White or Caucasian male patient seen here as a referral on 01/02/2021 . Chief concern according to patient :  PATIENT: Stephen York DOB: April 21, 1951  REASON FOR VISIT: OSA?  HISTORY FROM: patient  HISTORY OF PRESENT ILLNESS: [name], a 70 year-old caucasian male , originally from  [place], [place]. He presents after a 3 year hiatus- this time for a sleep apnea evaluation , initiated by his new primary care physician at [facility]. Based on a headache in AM, allergic  rhinitis, sinusitis, and  weight gain. He is sure he snores- loudly- his grandchildren complain about it. Has a deviated nasal septum fractured at age 70.  He is a recovering alcoholic, sober for 40 years and he has ADD/   He  has a past medical history of ADD (attention deficit hyperactivity disorder, inattentive type), Chronic sinusitis, Degenerative disk disease, Dizziness, Hyperlipidemia, history of polysubstance abuse, Kidney stones (october 2012), Knee problem, Obesity, and PTSD (post-traumatic stress disorder).    Sleep relevant medical history: , REM BD- in response to benadryl-Tonsillectomy, cervical spine DDD,  deviated septum, no RLS.     Family medical /sleep history: brother and sister on CPAP with OSA.    Social history:   patient was in [place], veteran of the [employer]- PTSD. Patient is retired from [employer] and lives in a household with spouse and 4 cats and 2 dogs-  2 grandchildren live with them. Son and daughter are grown, and healthy.  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    Tobacco use 1994.  ETOH use quit 1981,  Caffeine intake in form of Coffee( 2-3 in AM ) Soda( /) Tea ( /) or energy drinks. Regular exercise in form of yard work   Hobbies : handy man       Sleep habits are as follows: The patient's dinner time is between 6 PM. The patient goes to bed at 11 PM and continues to sleep for 4 hours, wakes for one bathroom break. Sleeps with melatonin.   The preferred sleep position is right side and supine- not on left, with the support of 1-2 pillows. Dreams are reportedly infrequent/vivid.  9  AM is the usual rise time. The patient wakes up spontaneously at 8 ,  if earlier he needs  an alarm.  He reports feeling  well, refreshed , restored in AM, with symptoms such as dry mouth, morning headache.. Naps are taken infrequently, lasting from 15 to 30 minutes and are more refreshing than nocturnal sleep.    Review of Systems: Out of a complete 14 system review, the patient complains of only the following symptoms, and all other reviewed systems are negative.:  Fatigue, snoring, some times medication related drowsiness.    How likely are you to doze in the following situations: 0 = not likely, 1 = slight chance, 2 = moderate chance, 3 = high chance   Sitting and Reading? Watching Television? 1-2 Sitting  inactive in a public place (theater or meeting)? As a passenger in a car for an hour without a break? Lying down in the afternoon when circumstances permit? 2 Sitting and talking to someone? Sitting quietly after lunch without alcohol? In a car, while stopped for a few minutes in traffic?   Total = 4/ 24 points   FSS endorsed at 10/ 63 points.  GDS 1/ 15 points   Social History   Socioeconomic History   Marital status: Married    Spouse name: Samara Deist   Number of children: 2   Years of education: 12   Highest education level: Not on file  Occupational History   Occupation: retired  Tobacco Use   Smoking status: Former    Packs/day: 2.00     Pack years: 0.00    Types: Cigarettes    Quit date: 09/29/1992    Years since quitting: 28.2   Smokeless tobacco: Never  Vaping Use   Vaping Use: Never used  Substance and Sexual Activity   Alcohol use: No    Alcohol/week: 0.0 standard drinks    Comment: quit in 1981   Drug use: Not Currently    Types: Marijuana, Mescaline, Other-see comments    Comment: acid. pt stopped 1981   Sexual activity: Never  Other Topics Concern   Not on file  Social History Narrative   Caffeine 3 cups in am,    Married, 2 kids. Retired.     Right handed   Social Determinants of Health   Financial Resource Strain: Not on file  Food Insecurity: Not on file  Transportation Needs: Not on file  Physical Activity: Not on file  Stress: Not on file  Social Connections: Not on file    Family History  Problem Relation Age of Onset   Aneurysm Mother    Alcohol abuse Father     Past Medical History:  Diagnosis Date   ADD (attention deficit hyperactivity disorder, inattentive type)    e   Chronic sinusitis    Degenerative disk disease    Dizziness    Hyperlipidemia    Kidney stones october 2012   passed it   Knee problem    left knee   Obesity    PTSD (post-traumatic stress disorder)     Past Surgical History:  Procedure Laterality Date   back surgeries     3     Current Outpatient Medications on File Prior to Visit  Medication Sig Dispense Refill   amLODipine (NORVASC) 10 MG tablet Take 10 mg by mouth daily.     atorvastatin (LIPITOR) 10 MG tablet Take 10 mg by mouth daily.     cholecalciferol (VITAMIN D) 1000 UNITS tablet Take 2,000 Units by mouth daily.      co-enzyme Q-10 50 MG capsule Take 100 mg by mouth daily.     docusate sodium (COLACE) 100 MG capsule Take 100 mg by mouth daily.     fenofibrate 160 MG tablet Take 160 mg by mouth daily.     hydrochlorothiazide (HYDRODIURIL) 25 MG tablet Take 25 mg by mouth daily.     HYDROcodone-acetaminophen (NORCO) 7.5-325 MG tablet Take 2  tablets by mouth daily.      lisinopril (PRINIVIL,ZESTRIL) 40 MG tablet Take 40 mg by mouth daily.     meloxicam (MOBIC) 15 MG tablet Take 15 mg by mouth daily.     methocarbamol (ROBAXIN) 500 MG tablet TAKE 1 TABLET TWICE A DAY AS NEEDED FOR SPASMS 20 tablet 0  methylphenidate (RITALIN) 10 MG tablet TAKE 1 TABLET DAILY 30 tablet 0   Omega-3 Fatty Acids (FISH OIL PO) Take 1,000 mg by mouth daily.     sertraline (ZOLOFT) 100 MG tablet      tiZANidine (ZANAFLEX) 4 MG tablet Take 4 mg by mouth daily.     UNABLE TO FIND Take 1 tablet by mouth daily. Med Name: Equate Heartburn     vitamin E 1000 UNIT capsule Take 400 Units by mouth daily.      No current facility-administered medications on file prior to visit.    Allergies  Allergen Reactions   Lipitor [Atorvastatin]     Physical exam:  There were no vitals filed for this visit.  There is no height or weight on file to calculate BMI.   Wt Readings from Last 3 Encounters:  01/02/21 244 lb 8 oz (110.9 kg)  08/30/20 262 lb (118.8 kg)  03/02/20 250 lb (113.4 kg)     Ht Readings from Last 3 Encounters:  01/02/21 6' (1.829 m)  08/30/20 5\' 11"  (1.803 m)  03/02/20 6' (1.829 m)      General: The patient is awake, alert and appears not in acute distress. The patient is well groomed. Head: Normocephalic, atraumatic. Neck is supple. Mallampati 3,  neck circumference:18 inches . Nasal airflow blocked- deviated septum to the left- not  patent.  Retrognathia is not seen.  Dental status:  Cardiovascular:  Regular rate and cardiac rhythm by pulse,  without distended neck veins. Respiratory: Lungs are clear to auscultation.  Skin:  Without evidence of ankle edema, or rash. Trunk: The patient's posture is erect.   Neurologic exam : The patient is awake and alert, oriented to place and time.   Memory subjective described as intact.  Attention span & concentration ability appears normal.  Speech is fluent,  without  dysarthria, but  dysphonia . Mood and affect are appropriate.   Cranial nerves: no loss of smell or taste reported  Pupils are equal and briskly reactive to light. Funduscopic exam deferred, beginning cataract. .  Extraocular movements in vertical and horizontal planes were intact and without nystagmus. No Diplopia. Visual fields by finger perimetry are intact. Hearing was intact to soft voice and finger rubbing.    Facial sensation intact to fine touch.  Facial motor strength is symmetric and tongue and uvula move midline.  Neck ROM : rotation, tilt and flexion extension were normal for age and shoulder shrug was symmetrical.    Motor exam:  Symmetric bulk, tone and ROM.   Normal tone without cog- wheeling, symmetric grip strength .   Sensory:  Fine touch, and vibration were tested  and  normal.  Proprioception tested in the upper extremities was normal.   Coordination: Rapid alternating movements in the fingers/hands were of normal speed.  The Finger-to-nose maneuver was intact without evidence of ataxia, dysmetria . He has mild ction induced tremor- no resting tremor.   Gait and station: Patient could rise unassisted from a seated position, walked without assistive device.  Stance is of normal width/ base.  Toe and heel walk were deferred.  Deep tendon reflexes: in the upper and lower extremities are symmetric and intact. Right knee with a brace-  Babinski response was  normal        After spending a total time of 45  minutes face to face and time for physical and neurologic examination, review of laboratory studies,  personal review of imaging studies, reports and results of other  testing and review of referral information / records as far as provided in visit, I have established the following assessments:  1) I have the pleasure of meeting today with Mr. Marlane Hatcher again after a 3.5-year hiatus.  His new primary care physician is concerned about him having possibly obstructive sleep apnea and there  are many risk factors for it.  He does have an elevated body mass index and its mostly abdominal girth.  So today's weight was 244 pounds.  He has a larger than average neck circumference and he has a grade 3 Mallampati.  But the most sleep apnea risk is 3 presented in form of his known patent nasal airway.  The patient reports that he had a traumatic fracture to the nasal septum and that this happened in his teenage years.  He has trouble to breathe through the nose without having additional allergy complications.  When he has allergies he also tends to completely obstruct his sinus outflow and he often then has sinus related headaches and these may even be present in the morning.  So there is allergic upper airway response as well as a fractured septum with severe deviation obstructing the airflow in the left nostril more than the right.  He also has a history of PTSD related to his been on service.  #3 he has a history of polysubstance abuse which probably is also related to his [place] service.  #4 he does report sleeping well subjectively he wakes up normally refreshed and restored in the morning sometimes he may have a dry mouth but is not a daily occurrence.  His reliance on Benadryl may help somewhat with nasal patency but it certainly also had given him a REM behavior disorder spell in the past so he is now using melatonin only and with good success.  I would be happy to screen him for sleep apnea but I do have to explain that treatments for sleep apnea usually rely on nasal airway patency.  So he may have a visit with ENT should he have apnea to a degree that warrants a positive airway pressure intervention.    My Plan is to proceed with:  1) PSG split study would be indicated- but his nasal obstruction should be addressed before we would  start any PAP treatment, if deemed necessary.   2) screening for OSA can be achieved with a HST.   I would like to thank Rankins, Fanny Dance, MD and No  referring provider defined for this encounter. for allowing me to meet with and to take care of this pleasant patient.   In short, Stephen York is presenting with nasal congestion, loud snoring and some sleepiness on some days, headaches and dry mouth. , a symptom that can be attributed to OSA,.  I plan to follow up either personally or through our NP within 2-4 month.     Electronically signed by: Melvyn Novas, MD 01/02/2021 1:48 PM  [facility] and [facility] Board certified by The [employer] of Sleep Medicine and Diplomate of the [employer] of Sleep Medicine. Board certified In Neurology through the ABPN, Fellow of the [employer] of Neu[employer]. Medical Director of [facility].

## 2021-01-02 NOTE — Progress Notes (Signed)
SLEEP MEDICINE CLINIC    Provider:  Melvyn Novasarmen  Mason Burleigh, MD  Primary Care Physician:  Clayborn Heronankins, Victoria R, MD [address] [place] [id number]     Referring Provider: Clayborn Heronankins, Victoria R, Md [address] [place] [id number]          Chief Complaint according to patient   Patient presents with:     New Patient (Initial Visit)           HISTORY OF PRESENT ILLNESS:  Stephen York is a 70 y.o. year old White or Caucasian male patient seen here as a referral on 01/02/2021 . Chief concern according to patient :  PATIENT: Stephen York DOB: 06/19/1951  REASON FOR VISIT: OSA?  HISTORY FROM: patient  HISTORY OF PRESENT ILLNESS: Stephen York, a 70 year-old caucasian male , originally from  [place]. He presents after a 3 year hiatus- this time for a sleep apnea evaluation , initiated by [facility]. Based on a headache in AM, allergic  rhinitis, sinusitis, and  weight gain. He is sure he snores- loudly- his grandchildren complain about it. Has a deviated nasal septum fractured at age 70.  He is a recovering alcoholic, sober for 40 years and he has ADD/   He  has a past medical history of ADD (attention deficit hyperactivity disorder, inattentive type), Chronic sinusitis, Degenerative disk disease, Dizziness, Hyperlipidemia, history of polysubstance abuse, Kidney stones (october 2012), Knee problem, Obesity, and PTSD (post-traumatic stress disorder).    Sleep relevant medical history: , REM BD- in response to benadryl-Tonsillectomy, cervical spine DDD,  deviated septum, no RLS.     Family medical /sleep history: brother and sister on CPAP with OSA.    Social history:   patient was in [place], veteran of the [employer]- PTSD. Patient is retired from [employer] and lives in a household with spouse and 4 cats and 2 dogs-  2 grandchildren live with them. Son and daughter are grown, and healthy.  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   Tobacco use 1994.  ETOH use quit 1981,  Caffeine intake in form of Coffee( 2-3 in AM ) Soda( /) Tea ( /) or energy drinks. Regular exercise in form of yard work   Hobbies : handy man       Sleep habits are as follows: The patient's dinner time is between 6 PM. The patient goes to bed at 11 PM and continues to sleep for 4 hours, wakes for one bathroom break. Sleeps with melatonin.   The preferred sleep position is right side and supine- not on left, with the support of 1-2 pillows. Dreams are reportedly infrequent/vivid.  9  AM is the usual rise time. The patient wakes up spontaneously at 8 ,  if earlier he needs  an alarm.  He reports feeling  well, refreshed , restored in AM, with symptoms such as dry mouth, morning headache.. Naps are taken infrequently, lasting from 15 to 30 minutes and are more refreshing than nocturnal sleep.    Review of Systems: Out of a complete 14 system review, the patient complains of only the following symptoms, and all other reviewed systems are negative.:  Fatigue, snoring, some times medication related drowsiness.    How likely are you to doze in the following situations: 0 = not likely, 1 = slight chance, 2 = moderate chance, 3 = high chance   Sitting and  Reading? Watching Television? 1-2 Sitting inactive in a public place (theater or meeting)? As a passenger in a car for an hour without a break? Lying down in the afternoon when circumstances permit? 2 Sitting and talking to someone? Sitting quietly after lunch without alcohol? In a car, while stopped for a few minutes in traffic?   Total = 4/ 24 points   FSS endorsed at 10/ 63 points.  GDS 1/ 15 points   Social History   Socioeconomic History   Marital status: Married    Spouse name: Samara Deist   Number of children: 2   Years of education: 12   Highest education level: Not on file  Occupational History   Occupation: retired  Tobacco Use   Smoking status: Former     Packs/day: 2.00    Pack years: 0.00    Types: Cigarettes    Quit date: 09/29/1992    Years since quitting: 28.2   Smokeless tobacco: Never  Vaping Use   Vaping Use: Never used  Substance and Sexual Activity   Alcohol use: No    Alcohol/week: 0.0 standard drinks    Comment: quit in 1981   Drug use: Not Currently    Types: Marijuana, Mescaline, Other-see comments    Comment: acid. pt stopped 1981   Sexual activity: Never  Other Topics Concern   Not on file  Social History Narrative   Caffeine 3 cups in am,    Married, 2 kids. Retired.     Right handed   Social Determinants of Health   Financial Resource Strain: Not on file  Food Insecurity: Not on file  Transportation Needs: Not on file  Physical Activity: Not on file  Stress: Not on file  Social Connections: Not on file    Family History  Problem Relation Age of Onset   Aneurysm Mother    Alcohol abuse Father     Past Medical History:  Diagnosis Date   ADD (attention deficit hyperactivity disorder, inattentive type)    e   Chronic sinusitis    Degenerative disk disease    Dizziness    Hyperlipidemia    Kidney stones october 2012   passed it   Knee problem    left knee   Obesity    PTSD (post-traumatic stress disorder)     Past Surgical History:  Procedure Laterality Date   back surgeries     3     Current Outpatient Medications on File Prior to Visit  Medication Sig Dispense Refill   ALPRAZolam (XANAX) 0.5 MG tablet Take 0.5 mg by mouth as needed.      amLODipine (NORVASC) 10 MG tablet Take 10 mg by mouth daily.     atorvastatin (LIPITOR) 10 MG tablet Take 10 mg by mouth daily.     cholecalciferol (VITAMIN D) 1000 UNITS tablet Take 2,000 Units by mouth daily.      co-enzyme Q-10 50 MG capsule Take 100 mg by mouth daily.     docusate sodium (COLACE) 100 MG capsule Take 100 mg by mouth daily.     fenofibrate 160 MG tablet Take 160 mg by mouth daily.     hydrochlorothiazide (HYDRODIURIL) 25 MG  tablet Take 25 mg by mouth daily.     HYDROcodone-acetaminophen (NORCO) 7.5-325 MG tablet Take 2 tablets by mouth daily.      lisinopril (PRINIVIL,ZESTRIL) 40 MG tablet Take 40 mg by mouth daily.     meloxicam (MOBIC) 15 MG tablet Take 15 mg by mouth daily.  methocarbamol (ROBAXIN) 500 MG tablet TAKE 1 TABLET TWICE A DAY AS NEEDED FOR SPASMS 20 tablet 0   methylphenidate (RITALIN) 10 MG tablet TAKE 1 TABLET DAILY 30 tablet 0   Omega-3 Fatty Acids (FISH OIL PO) Take 1,000 mg by mouth daily.     sertraline (ZOLOFT) 100 MG tablet      tiZANidine (ZANAFLEX) 4 MG tablet Take 4 mg by mouth daily.     UNABLE TO FIND Take 1 tablet by mouth daily. Med Name: Equate Heartburn     vitamin E 1000 UNIT capsule Take 400 Units by mouth daily.      No current facility-administered medications on file prior to visit.    Allergies  Allergen Reactions   Lipitor [Atorvastatin]     Physical exam:  Today's Vitals   01/02/21 1252  BP: 108/74  Pulse: 83  Weight: 244 lb 8 oz (110.9 kg)  Height: 6' (1.829 m)   Body mass index is 33.16 kg/m.   Wt Readings from Last 3 Encounters:  01/02/21 244 lb 8 oz (110.9 kg)  08/30/20 262 lb (118.8 kg)  03/02/20 250 lb (113.4 kg)     Ht Readings from Last 3 Encounters:  01/02/21 6' (1.829 m)  08/30/20 5\' 11"  (1.803 m)  03/02/20 6' (1.829 m)      General: The patient is awake, alert and appears not in acute distress. The patient is well groomed. Head: Normocephalic, atraumatic. Neck is supple. Mallampati 3,  neck circumference:18 inches . Nasal airflow blocked- deviated septum to the left- not  patent.  Retrognathia is not seen.  Dental status:  Cardiovascular:  Regular rate and cardiac rhythm by pulse,  without distended neck veins. Respiratory: Lungs are clear to auscultation.  Skin:  Without evidence of ankle edema, or rash. Trunk: The patient's posture is erect.   Neurologic exam : The patient is awake and alert, oriented to place and time.    Memory subjective described as intact.  Attention span & concentration ability appears normal.  Speech is fluent,  without  dysarthria, but dysphonia . Mood and affect are appropriate.   Cranial nerves: no loss of smell or taste reported  Pupils are equal and briskly reactive to light. Funduscopic exam deferred, beginning cataract. .  Extraocular movements in vertical and horizontal planes were intact and without nystagmus. No Diplopia. Visual fields by finger perimetry are intact. Hearing was intact to soft voice and finger rubbing.    Facial sensation intact to fine touch.  Facial motor strength is symmetric and tongue and uvula move midline.  Neck ROM : rotation, tilt and flexion extension were normal for age and shoulder shrug was symmetrical.    Motor exam:  Symmetric bulk, tone and ROM.   Normal tone without cog- wheeling, symmetric grip strength .   Sensory:  Fine touch, and vibration were tested  and  normal.  Proprioception tested in the upper extremities was normal.   Coordination: Rapid alternating movements in the fingers/hands were of normal speed.  The Finger-to-nose maneuver was intact without evidence of ataxia, dysmetria . He has mild ction induced tremor- no resting tremor.   Gait and station: Patient could rise unassisted from a seated position, walked without assistive device.  Stance is of normal width/ base.  Toe and heel walk were deferred.  Deep tendon reflexes: in the upper and lower extremities are symmetric and intact. Right knee with a brace-  Babinski response was  normal        After  spending a total time of 45  minutes face to face and time for physical and neurologic examination, review of laboratory studies,  personal review of imaging studies, reports and results of other testing and review of referral information / records as far as provided in visit, I have established the following assessments:  1) I have the pleasure of meeting today with Mr.  Marlane York again after a 3.5-year hiatus.  His new primary care physician is concerned about him having possibly obstructive sleep apnea and there are many risk factors for it.  He does have an elevated body mass index and its mostly abdominal girth.  So today's weight was 244 pounds.  He has a larger than average neck circumference and he has a grade 3 Mallampati.  But the most sleep apnea risk is 3 presented in form of his known patent nasal airway.  The patient reports that he had a traumatic fracture to the nasal septum and that this happened in his teenage years.  He has trouble to breathe through the nose without having additional allergy complications.  When he has allergies he also tends to completely obstruct his sinus outflow and he often then has sinus related headaches and these may even be present in the morning.  So there is allergic upper airway response as well as a fractured septum with severe deviation obstructing the airflow in the left nostril more than the right.  He also has a history of PTSD related to his been on service.  #3 he has a history of polysubstance abuse which probably is also related to his [place] service.  #4 he does report sleeping well subjectively he wakes up normally refreshed and restored in the morning sometimes he may have a dry mouth but is not a daily occurrence.  His reliance on Benadryl may help somewhat with nasal patency but it certainly also had given him a REM behavior disorder spell in the past so he is now using melatonin only and with good success.  I would be happy to screen him for sleep apnea but I do have to explain that treatments for sleep apnea usually rely on nasal airway patency.  So he may have a visit with ENT should he have apnea to a degree that warrants a positive airway pressure intervention.    My Plan is to proceed with:  1) PSG split study would be indicated- but his nasal obstruction should be addressed before we would  start any PAP  treatment, if deemed necessary.   2) screening for OSA can be achieved with a HST.   I would like to thank Rankins, Fanny Dance, MD and Rankins, Fanny Dance, Md [address] [place],  [place] [address] for allowing me to meet with and to take care of this pleasant patient.   In short, Stephen York is presenting with nasal congestion, loud snoring and some sleepiness on some days, headaches and dry mouth. , a symptom that can be attributed to OSA,.  I plan to follow up either personally or through our NP within 2-4 month.     Electronically signed by: Melvyn Novas, MD 01/02/2021 1:17 PM  [facility] Board certified by The [employer] of Sleep Medicine and Diplomate of the [employer] of Sleep Medicine. Board certified In Neurology through the ABPN, Fellow of the [employer] of Neurology. Medical Director of [facility].

## 2021-01-02 NOTE — Patient Instructions (Signed)

## 2021-01-29 ENCOUNTER — Ambulatory Visit (INDEPENDENT_AMBULATORY_CARE_PROVIDER_SITE_OTHER): Payer: Medicare Other | Admitting: Neurology

## 2021-01-29 DIAGNOSIS — G4733 Obstructive sleep apnea (adult) (pediatric): Secondary | ICD-10-CM | POA: Diagnosis not present

## 2021-01-29 DIAGNOSIS — F900 Attention-deficit hyperactivity disorder, predominantly inattentive type: Secondary | ICD-10-CM

## 2021-01-29 DIAGNOSIS — R0683 Snoring: Secondary | ICD-10-CM

## 2021-01-29 DIAGNOSIS — I10 Essential (primary) hypertension: Secondary | ICD-10-CM

## 2021-01-29 DIAGNOSIS — Z8709 Personal history of other diseases of the respiratory system: Secondary | ICD-10-CM

## 2021-02-06 ENCOUNTER — Other Ambulatory Visit: Payer: Self-pay | Admitting: *Deleted

## 2021-02-06 ENCOUNTER — Encounter: Payer: Self-pay | Admitting: *Deleted

## 2021-02-06 ENCOUNTER — Telehealth: Payer: Self-pay | Admitting: *Deleted

## 2021-02-06 DIAGNOSIS — G4733 Obstructive sleep apnea (adult) (pediatric): Secondary | ICD-10-CM

## 2021-02-06 DIAGNOSIS — R0902 Hypoxemia: Secondary | ICD-10-CM

## 2021-02-06 NOTE — Procedures (Signed)
[facility]   HOME SLEEP TEST REPORT ( by Watch PAT)   STUDY DATA:  02-04-2021 DOB:  22-Aug-1950 MRN: [id number]   ORDERING CLINICIAN: C.Devaun Hernandez, MD REFERRING CLINICIAN: Leitha Schuller, MD   CLINICAL INFORMATION/HISTORY: [name], a 70 year-old caucasian male , originally from  [place], [place]. He presents after a 3 year hiatus- this time for a sleep apnea evaluation , initiated by his new primary care physician at [facility]. Based on a headache in AM, allergic  rhinitis, sinusitis, and  weight gain. He is sure he snores- loudly- his grandchildren complain about it. Has a deviated nasal septum fractured at age 29.  He is a recovering alcoholic, sober for 40 years . He has a medical history of ADD (attention deficit hyperactivity disorder, inattentive type), Chronic sinusitis, Degenerative disk disease, Dizziness, Hyperlipidemia, Kidney stones (october 2012), Obesity, and PTSD (post-traumatic stress disorder)   Epworth sleepiness score: 4/24.   BMI: 33.1 kg/m   Neck Circumference: 18      Sleep Study Summary:   Total Recording Time (hours, min): The total recording time was 8 hours and 57 minutes of which the total sleep time amounted to 8 hours and 13 minutes with 22.7% REM sleep.       Respiratory Indices:   The overall apnea-hypopnea index was 50.8 with a REM AHI being 55/h non-REM 49.6/h.   Desaturation by 4% or more occurred 35.2 times per hour of sleep.  There were no positional data available.                                                 Oxygen Saturation Statistics: Oxygen saturation at nadir was 77% SPO2 maximum saturation was 98% and mean saturation for oxygen was 90%.  The patient spent 168 minutes in hypoxia equivalent to 34.2% of the total sleep time.    Pulse Rate Statistics:   Pulse Mean (bpm):      a minimum heart rate was not established but could be gathered from the hip milligram.  It seems that the minimum heart rate was between 50 and 45 bpm.   maximum heart  rate was 109 bpm mean heart rate was 65 bpm.  Please note that a home sleep test cannot give data about cardiac rhythm, only cardiac rate.                 IMPRESSION:  This HST confirms the presence of hypoxemia and severe sleep apnea, not REM dependent.   The severe apnea usually requires CPAP treatment or BiPAP treatment.  The patient has a severely impaired nasal airflow and this can cause intolerance of CPAP as a therapy relies on airflow through the nasal passage.  I had referred the patient for treatment of her nasal obstruction before we started any CPAP treatment.  I would like to initiate an in lab titration to allow for oxygen saturations to be evaluated as well.  If his insurance denies that possibility I would order an auto CPAP at factory settings 5 through 20 cm, 3 cm EPR, heated humidification and a mask of patient's comfort.   RECOMMENDATION:I would like to initiate an in lab titration to allow for oxygen saturations to be evaluated as well.  If his insurance denies that possibility I would order an auto CPAP at factory settings 5 through 20 cm, 3 cm EPR,  heated humidification and a mask of patient's comfort.    INTERPRETING PHYSICIAN:   Melvyn Novas, MD   Medical Director of [facility] at [facility].

## 2021-02-06 NOTE — Progress Notes (Signed)
[facility]   HOME SLEEP TEST REPORT ( by Watch PAT)   STUDY DATA:  02-04-2021 DOB:  09/19/1950 MRN: [id number]   ORDERING CLINICIAN: C.Dohmeier, MD REFERRING CLINICIAN: V. Rankins, MD   CLINICAL INFORMATION/HISTORY: [name], a 70 year-old caucasian male , originally from  [place], [place]. He presents after a 3 year hiatus- this time for a sleep apnea evaluation , initiated by his new primary care physician at [facility]. Based on a headache in AM, allergic  rhinitis, sinusitis, and  weight gain. He is sure he snores- loudly- his grandchildren complain about it. Has a deviated nasal septum fractured at age 14.  He is a recovering alcoholic, sober for 40 years . He has a medical history of ADD (attention deficit hyperactivity disorder, inattentive type), Chronic sinusitis, Degenerative disk disease, Dizziness, Hyperlipidemia, Kidney stones (october 2012), Obesity, and PTSD (post-traumatic stress disorder)   Epworth sleepiness score: 4/24.   BMI: 33.1 kg/m   Neck Circumference: 18      Sleep Study Summary:   Total Recording Time (hours, min): The total recording time was 8 hours and 57 minutes of which the total sleep time amounted to 8 hours and 13 minutes with 22.7% REM sleep.       Respiratory Indices:   The overall apnea-hypopnea index was 50.8 with a REM AHI being 55/h non-REM 49.6/h.   Desaturation by 4% or more occurred 35.2 times per hour of sleep.  There were no positional data available.                                                 Oxygen Saturation Statistics: Oxygen saturation at nadir was 77% SPO2 maximum saturation was 98% and mean saturation for oxygen was 90%.  The patient spent 168 minutes in hypoxia equivalent to 34.2% of the total sleep time.    Pulse Rate Statistics:   Pulse Mean (bpm):      a minimum heart rate was not established but could be gathered from the hip milligram.  It seems that the minimum heart rate was between 50 and 45 bpm.   maximum heart  rate was 109 bpm mean heart rate was 65 bpm.  Please note that a home sleep test cannot give data about cardiac rhythm, only cardiac rate.                 IMPRESSION:  This HST confirms the presence of hypoxemia and severe sleep apnea, not REM dependent.   The severe apnea usually requires CPAP treatment or BiPAP treatment.  The patient has a severely impaired nasal airflow and this can cause intolerance of CPAP as a therapy relies on airflow through the nasal passage.  I had referred the patient for treatment of her nasal obstruction before we started any CPAP treatment.  I would like to initiate an in lab titration to allow for oxygen saturations to be evaluated as well.  If his insurance denies that possibility I would order an auto CPAP at factory settings 5 through 20 cm, 3 cm EPR, heated humidification and a mask of patient's comfort.   RECOMMENDATION:I would like to initiate an in lab titration to allow for oxygen saturations to be evaluated as well.  If his insurance denies that possibility I would order an auto CPAP at factory settings 5 through 20 cm, 3 cm EPR,   heated humidification and a mask of patient's comfort.    INTERPRETING PHYSICIAN:   Carmen Dohmeier, MD   Medical Director of [facility].            

## 2021-02-06 NOTE — Telephone Encounter (Signed)
I called pt. I advised pt that Dr. Vickey Huger reviewed their sleep study results and found that pt had severe sleep apnea. Dr. Vickey Huger recommends that pt start on CPAP. I reviewed PAP compliance expectations with the pt. Pt is agreeable to starting a CPAP. I advised pt that an order will be sent to a DME, [facility], and [facility] will call the pt within about one week after they file with the pt's insurance. [facility] will show the pt how to use the machine, fit for masks, and troubleshoot the CPAP if needed. A follow up appt was made for insurance purposes with Shawnie Dapper, NP on 03/29/21 at 10:30a. Pt verbalized understanding to arrive 15 minutes early and bring their CPAP. A letter with all of this information in it will be mailed to the pt as a reminder. I verified with the pt that the address we have on file is correct. Pt verbalized understanding of results. Pt had no questions at this time but was encouraged to call back if questions arise. I have sent the order to [facility] and have received confirmation that they have received the order.

## 2021-02-06 NOTE — Telephone Encounter (Signed)
-----   Message from Melvyn Novas, MD sent at 02/06/2021  4:06 PM EDT ----- IMPRESSION:  This HST confirms the presence of hypoxemia and severe sleep apnea, not REM dependent.   The severe apnea usually requires CPAP treatment or BiPAP treatment.  The patient has a severely impaired nasal airflow and this can cause intolerance of CPAP as a therapy relies on airflow through the nasal passage.  I had referred the patient for treatment of her nasal obstruction before we started any CPAP treatment.  I would like to initiate an in lab titration to allow for oxygen saturations to be evaluated as well.  If his insurance denies that possibility I would order an auto CPAP at factory settings 5 through 20 cm, 3 cm EPR, heated humidification and a mask of patient's comfort.  RECOMMENDATION:I would like to initiate an in lab titration to allow for oxygen saturations to be evaluated as well.  If his insurance denies that possibility I would order an auto CPAP at factory settings 5 through 20 cm, 3 cm EPR, heated humidification and a mask of patient's comfort.

## 2021-02-13 DIAGNOSIS — G4733 Obstructive sleep apnea (adult) (pediatric): Secondary | ICD-10-CM | POA: Diagnosis not present

## 2021-03-07 ENCOUNTER — Ambulatory Visit: Payer: Medicare Other | Admitting: Adult Health

## 2021-03-07 DIAGNOSIS — G4733 Obstructive sleep apnea (adult) (pediatric): Secondary | ICD-10-CM | POA: Diagnosis not present

## 2021-03-16 DIAGNOSIS — G4733 Obstructive sleep apnea (adult) (pediatric): Secondary | ICD-10-CM | POA: Diagnosis not present

## 2021-03-28 DIAGNOSIS — M65342 Trigger finger, left ring finger: Secondary | ICD-10-CM | POA: Diagnosis not present

## 2021-03-29 ENCOUNTER — Ambulatory Visit: Payer: Medicare Other | Admitting: Adult Health

## 2021-03-29 ENCOUNTER — Encounter: Payer: Self-pay | Admitting: Adult Health

## 2021-03-29 VITALS — BP 131/80 | HR 84 | Ht 71.0 in | Wt 251.0 lb

## 2021-03-29 DIAGNOSIS — Z9989 Dependence on other enabling machines and devices: Secondary | ICD-10-CM

## 2021-03-29 DIAGNOSIS — G4733 Obstructive sleep apnea (adult) (pediatric): Secondary | ICD-10-CM | POA: Diagnosis not present

## 2021-03-29 NOTE — Progress Notes (Signed)
PATIENT: Stephen York DOB: 1951-01-30  REASON FOR VISIT: follow up HISTORY FROM: patient   HISTORY OF PRESENT ILLNESS: Today 03/29/21:  Stephen York is a 70 year old male with a history of obstructive sleep apnea on CPAP.  The patient returns today for his initial CPAP visit.  His download indicates that he uses machine 29 out of 30 days for compliance of 97%.  He uses machine greater than 4 hours each night.  On average he uses his machine 8 hours.  His residual AHI is 2.9 on 5 to 20 cm of water.  He does not have a significant leak.  His pressure in the 95th percentile is 10.9 cm of water he reports that he noticed a significant difference in how he feels.  No longer waking up with headaches.  He has been taking Ritalin daily for ADD.  However he is considering doing a trial off the medication now that he is on CPAP.   REVIEW OF SYSTEMS: Out of a complete 14 system review of symptoms, the patient complains only of the following symptoms, and all other reviewed systems are negative.  FSS 13 ESS 2  ALLERGIES: Allergies  Allergen Reactions   Lipitor [Atorvastatin]     HOME MEDICATIONS: Outpatient Medications Prior to Visit  Medication Sig Dispense Refill   amLODipine (NORVASC) 10 MG tablet Take 10 mg by mouth daily.     atorvastatin (LIPITOR) 10 MG tablet Take 10 mg by mouth daily.     cholecalciferol (VITAMIN D) 1000 UNITS tablet Take 2,000 Units by mouth daily.      co-enzyme Q-10 50 MG capsule Take 100 mg by mouth daily.     docusate sodium (COLACE) 100 MG capsule Take 100 mg by mouth daily.     fenofibrate 160 MG tablet Take 160 mg by mouth daily.     hydrochlorothiazide (HYDRODIURIL) 25 MG tablet Take 25 mg by mouth daily.     HYDROcodone-acetaminophen (NORCO) 7.5-325 MG tablet Take 2 tablets by mouth daily.      lisinopril (PRINIVIL,ZESTRIL) 40 MG tablet Take 40 mg by mouth daily.     meloxicam (MOBIC) 15 MG tablet Take 15 mg by mouth daily.     methocarbamol  (ROBAXIN) 500 MG tablet TAKE 1 TABLET TWICE A DAY AS NEEDED FOR SPASMS 20 tablet 0   methylphenidate (RITALIN) 10 MG tablet Take 1 tablet (10 mg total) by mouth daily. 90 tablet 0   Omega-3 Fatty Acids (FISH OIL PO) Take 1,000 mg by mouth daily.     sertraline (ZOLOFT) 100 MG tablet      tiZANidine (ZANAFLEX) 4 MG tablet Take 4 mg by mouth daily.     UNABLE TO FIND Take 1 tablet by mouth daily. Med Name: Equate Heartburn     vitamin E 1000 UNIT capsule Take 400 Units by mouth daily.      No facility-administered medications prior to visit.    PAST MEDICAL HISTORY: Past Medical History:  Diagnosis Date   ADD (attention deficit hyperactivity disorder, inattentive type)    e   Chronic sinusitis    Degenerative disk disease    Dizziness    Hyperlipidemia    Kidney stones october 2012   passed it   Knee problem    left knee   Obesity    PTSD (post-traumatic stress disorder)     PAST SURGICAL HISTORY: Past Surgical History:  Procedure Laterality Date   back surgeries     3    FAMILY  HISTORY: Family History  Problem Relation Age of Onset   Aneurysm Mother    Alcohol abuse Father     SOCIAL HISTORY: Social History   Socioeconomic History   Marital status: Married    Spouse name: Samara Deist   Number of children: 2   Years of education: 12   Highest education level: Not on file  Occupational History   Occupation: retired  Tobacco Use   Smoking status: Former    Packs/day: 2.00    Types: Cigarettes    Quit date: 09/29/1992    Years since quitting: 28.5   Smokeless tobacco: Never  Vaping Use   Vaping Use: Never used  Substance and Sexual Activity   Alcohol use: No    Alcohol/week: 0.0 standard drinks    Comment: quit in 1981   Drug use: Not Currently    Types: Marijuana, Mescaline, Other-see comments    Comment: acid. pt stopped 1981   Sexual activity: Never  Other Topics Concern   Not on file  Social History Narrative   Caffeine 3 cups in am,    Married, 2  kids. Retired.     Right handed   Social Determinants of Health   Financial Resource Strain: Not on file  Food Insecurity: Not on file  Transportation Needs: Not on file  Physical Activity: Not on file  Stress: Not on file  Social Connections: Not on file  Intimate Partner Violence: Not on file      PHYSICAL EXAM  Vitals:   03/29/21 1005  BP: 131/80  Pulse: 84  Weight: 251 lb (113.9 kg)  Height: 5\' 11"  (1.803 m)   Body mass index is 35.01 kg/m.  Generalized: Well developed, in no acute distress  Chest: Lungs clear to auscultation bilaterally  Neurological examination  Mentation: Alert oriented to time, place, history taking. Follows all commands speech and language fluent Cranial nerve II-XII: Extraocular movements were full, visual field were full on confrontational test Head turning and shoulder shrug  were normal and symmetric. Motor: The motor testing reveals 5 over 5 strength of all 4 extremities. Good symmetric motor tone is noted throughout.  Sensory: Sensory testing is intact to soft touch on all 4 extremities. No evidence of extinction is noted.  Gait and station: Gait is normal.    DIAGNOSTIC DATA (LABS, IMAGING, TESTING) - I reviewed patient records, labs, notes, testing and imaging myself where available.  No results found for: WBC, HGB, HCT, MCV, PLT    Component Value Date/Time   NA 139 08/31/2018 1017   K 3.9 08/31/2018 1017   CL 107 08/31/2018 1017   CO2 24 08/31/2018 1017   GLUCOSE 112 (H) 08/31/2018 1017   BUN 13 08/31/2018 1017   CREATININE 0.89 08/31/2018 1017   CALCIUM 9.6 08/31/2018 1017   GFRNONAA >60 08/31/2018 1017   GFRAA >60 08/31/2018 1017      ASSESSMENT AND PLAN 70 y.o. year old male  has a past medical history of ADD (attention deficit hyperactivity disorder, inattentive type), Chronic sinusitis, Degenerative disk disease, Dizziness, Hyperlipidemia, Kidney stones (october 2012), Knee problem, Obesity, and PTSD (post-traumatic  stress disorder). here with:  OSA on CPAP  - CPAP compliance excellent - Good treatment of AHI  - Encourage patient to use CPAP nightly and > 4 hours each night - Advised that he could do a trial off of Ritalin to see if he still needs this medication - F/U in 6 months or sooner if needed    01-31-1999,  MSN, NP-C 03/29/2021, 11:08 AM [facility] [address] [place], [place] [address] [phone]

## 2021-04-13 DIAGNOSIS — E7800 Pure hypercholesterolemia, unspecified: Secondary | ICD-10-CM | POA: Diagnosis not present

## 2021-04-13 DIAGNOSIS — E782 Mixed hyperlipidemia: Secondary | ICD-10-CM | POA: Diagnosis not present

## 2021-04-13 DIAGNOSIS — M47816 Spondylosis without myelopathy or radiculopathy, lumbar region: Secondary | ICD-10-CM | POA: Diagnosis not present

## 2021-04-13 DIAGNOSIS — I10 Essential (primary) hypertension: Secondary | ICD-10-CM | POA: Diagnosis not present

## 2021-04-13 DIAGNOSIS — E785 Hyperlipidemia, unspecified: Secondary | ICD-10-CM | POA: Diagnosis not present

## 2021-04-15 DIAGNOSIS — G4733 Obstructive sleep apnea (adult) (pediatric): Secondary | ICD-10-CM | POA: Diagnosis not present

## 2021-04-20 ENCOUNTER — Encounter (HOSPITAL_BASED_OUTPATIENT_CLINIC_OR_DEPARTMENT_OTHER): Payer: Self-pay | Admitting: Orthopedic Surgery

## 2021-04-20 ENCOUNTER — Other Ambulatory Visit: Payer: Self-pay

## 2021-04-20 NOTE — Progress Notes (Signed)
Spoke w/ via phone for pre-op interview--- pt Lab needs dos----  [employer] and ekg             Lab results------ no COVID test -----patient states asymptomatic no test needed Arrive at ------- 0645 on 04-26-2021 NPO after MN NO Solid Food.  Clear liquids from MN until--- 0545 Med rec completed Medications to take morning of surgery ----- coreg, nexium norvasc Diabetic medication ----- n/a Patient instructed no nail polish to be worn day of surgery Patient instructed to bring photo id and insurance card day of surgery Patient aware to have Driver (ride ) / caregiver for 24 hours after surgery --wife, Stephen York Patient Special Instructions ----- asked to bring cpap/ mask/ tubing and leave with wife Pre-Op special Istructions ----- pre-op orders pending, sent inbox message to dr Yehuda Budd in epic Patient verbalized understanding of instructions that were given at this phone interview. Patient denies shortness of breath, chest pain, fever, cough at this phone interview.

## 2021-04-22 NOTE — H&P (Signed)
Preoperative History & Physical Exam  Surgeon: Philipp Ovens, MD  Diagnosis: left ring trigger finger  Planned Procedure: Procedure(s) (LRB): RELEASE TRIGGER FINGER/A-1 PULLEY (Left) ring finger  History of Present Illness:   Patient is a 70 y.o. male with symptoms consistent with left ring finger trigger finger who presents for surgical intervention. The risks, benefits and alternatives of surgical intervention were discussed and informed consent was obtained prior to surgery.  Past Medical History:  Past Medical History:  Diagnosis Date   ADD (attention deficit hyperactivity disorder, inattentive type)    Chronic gout    04-20-2021  pt stated effect bilateral great toe's,  last episode several months ago   Chronic low back pain    Chronic sinusitis    Degenerative disc disease, lumbar    GERD (gastroesophageal reflux disease)    Hiatal hernia    History of kidney stones 03/25/2011   passed it   History of substance use    pt stated stopped 1981 at age 65   Hyperlipidemia    Hypertension    followed by pcp   (04-20-2021  pt stated he had a stress test approx. 2007, told ok)   OA (osteoarthritis)    hands, feets, shoulders, knees   OSA on CPAP    followed by dr dohmeier;   sleep study in epic 01-29-2021 severe osa uses cpap nightly   Personal history of alcoholism (HCC)    pt stated stopped 1981 at age 46   Pre-diabetes    PTSD (post-traumatic stress disorder)    Trigger finger, left    ring    Past Surgical History:  Past Surgical History:  Procedure Laterality Date   FOOT SURGERY Bilateral 2002   approx.  ;     great toe both feet for arthritis   KNEE ARTHROSCOPY Left 2016   LUMBAR FUSION  1995   L4-5   LUMBAR MICRODISCECTOMY     1988  L4-5;  redo 1991 L4-5   SHOULDER ARTHROSCOPY Left 2008   approx   TRIGGER FINGER RELEASE Right 06/2020   ring finger    Medications:  Prior to Admission medications   Medication Sig Start Date End Date Taking?  Authorizing Provider  acetaminophen (TYLENOL) 500 MG tablet Take 500 mg by mouth every 6 (six) hours as needed. Per pt will take one tablet daily in evening   Yes [provider]  allopurinol (ZYLOPRIM) 300 MG tablet Take 300 mg by mouth daily.   Yes [provider]  amLODipine (NORVASC) 10 MG tablet Take 10 mg by mouth daily.   Yes [provider]  atorvastatin (LIPITOR) 20 MG tablet Take 20 mg by mouth daily.   Yes [provider]  Carboxymethylcellulose Sodium (DRY EYE RELIEF OP) Apply to eye as needed.   Yes [provider]  carvedilol (COREG) 6.25 MG tablet Take 6.25 mg by mouth daily.   Yes [provider]  Cholecalciferol (VITAMIN D3) 75 MCG (3000 UT) TABS Take 1 tablet by mouth daily.   Yes [provider]  Coenzyme Q10 (COQ-10) 100 MG CAPS Take 1 capsule by mouth at bedtime.   Yes [provider]  esomeprazole (NEXIUM) 20 MG capsule Take 20 mg by mouth daily at 12 noon.   Yes [provider]  HYDROcodone-acetaminophen (NORCO/VICODIN) 5-325 MG tablet Take 1 tablet by mouth daily.   Yes [provider]  Melatonin 10 MG CAPS Take 1 capsule by mouth at bedtime.   Yes [provider]  meloxicam (MOBIC) 15 MG tablet Take 15 mg by mouth every evening.   Yes [provider]  Menthol, Topical Analgesic, (BLUE GEL EX) Apply topically as needed.   Yes [provider]  methylphenidate (RITALIN) 10 MG tablet Take 1 tablet (10 mg total) by mouth daily. 01/02/21  Yes Dohmeier, Porfirio Mylar, MD  Potassium 99 MG TABS Take 1 tablet by mouth daily.   Yes [provider]  sertraline (ZOLOFT) 100 MG tablet Take 100 mg by mouth at bedtime.   Yes [provider]  vitamin E 1000 UNIT capsule Take 400 Units by mouth daily.    Yes [provider]    Allergies:  Patient has no known allergies.  Review of Systems: Negative except per HPI.  Physical Exam: Alert and oriented,  NAD Head and neck: no masses, normal alignment CV: pulse intact Pulm: no increased work of breathing, respirations even and unlabored Abdomen: non-distended Extremities: extremities warm and well perfused  LABS: No results found for this or any previous visit (from the past 2160 hour(s)).   Complete History and Physical exam available in the office notes  Gomez Cleverly

## 2021-04-26 ENCOUNTER — Other Ambulatory Visit: Payer: Self-pay

## 2021-04-26 ENCOUNTER — Ambulatory Visit (HOSPITAL_BASED_OUTPATIENT_CLINIC_OR_DEPARTMENT_OTHER)
Admission: RE | Admit: 2021-04-26 | Discharge: 2021-04-26 | Disposition: A | Discharge status: Home / Self Care | Payer: Medicare Other | Source: Ambulatory Visit | Attending: Orthopedic Surgery | Admitting: Orthopedic Surgery

## 2021-04-26 ENCOUNTER — Other Ambulatory Visit: Payer: Self-pay | Admitting: Neurology

## 2021-04-26 ENCOUNTER — Encounter (HOSPITAL_BASED_OUTPATIENT_CLINIC_OR_DEPARTMENT_OTHER)
Admission: RE | Disposition: A | Discharge status: Home / Self Care | Payer: Self-pay | Source: Ambulatory Visit | Attending: Orthopedic Surgery

## 2021-04-26 ENCOUNTER — Ambulatory Visit (HOSPITAL_BASED_OUTPATIENT_CLINIC_OR_DEPARTMENT_OTHER): Payer: Medicare Other | Admitting: Anesthesiology

## 2021-04-26 ENCOUNTER — Encounter (HOSPITAL_BASED_OUTPATIENT_CLINIC_OR_DEPARTMENT_OTHER): Payer: Self-pay | Admitting: Orthopedic Surgery

## 2021-04-26 DIAGNOSIS — F988 Other specified behavioral and emotional disorders with onset usually occurring in childhood and adolescence: Secondary | ICD-10-CM

## 2021-04-26 DIAGNOSIS — G4733 Obstructive sleep apnea (adult) (pediatric): Secondary | ICD-10-CM | POA: Diagnosis not present

## 2021-04-26 DIAGNOSIS — Z791 Long term (current) use of non-steroidal anti-inflammatories (NSAID): Secondary | ICD-10-CM | POA: Insufficient documentation

## 2021-04-26 DIAGNOSIS — Z87891 Personal history of nicotine dependence: Secondary | ICD-10-CM | POA: Insufficient documentation

## 2021-04-26 DIAGNOSIS — I10 Essential (primary) hypertension: Secondary | ICD-10-CM | POA: Insufficient documentation

## 2021-04-26 DIAGNOSIS — M65342 Trigger finger, left ring finger: Secondary | ICD-10-CM | POA: Insufficient documentation

## 2021-04-26 DIAGNOSIS — Z79899 Other long term (current) drug therapy: Secondary | ICD-10-CM | POA: Insufficient documentation

## 2021-04-26 DIAGNOSIS — M6530 Trigger finger, unspecified finger: Secondary | ICD-10-CM | POA: Diagnosis not present

## 2021-04-26 DIAGNOSIS — Z9989 Dependence on other enabling machines and devices: Secondary | ICD-10-CM | POA: Diagnosis not present

## 2021-04-26 HISTORY — PX: TRIGGER FINGER RELEASE: SHX641

## 2021-04-26 HISTORY — DX: Other intervertebral disc degeneration, lumbar region without mention of lumbar back pain or lower extremity pain: M51.369

## 2021-04-26 HISTORY — DX: Other intervertebral disc degeneration, lumbar region: M51.36

## 2021-04-26 HISTORY — DX: Low back pain, unspecified: M54.50

## 2021-04-26 HISTORY — DX: Unspecified osteoarthritis, unspecified site: M19.90

## 2021-04-26 HISTORY — DX: Obstructive sleep apnea (adult) (pediatric): G47.33

## 2021-04-26 HISTORY — DX: Trigger finger, unspecified finger: M65.30

## 2021-04-26 HISTORY — DX: Chronic gout, unspecified, without tophus (tophi): M1A.9XX0

## 2021-04-26 HISTORY — DX: Gastro-esophageal reflux disease without esophagitis: K21.9

## 2021-04-26 HISTORY — DX: Personal history of other specified conditions: Z87.898

## 2021-04-26 HISTORY — DX: Essential (primary) hypertension: I10

## 2021-04-26 HISTORY — DX: Alcohol dependence, in remission: F10.21

## 2021-04-26 HISTORY — DX: Prediabetes: R73.03

## 2021-04-26 HISTORY — DX: Other chronic pain: G89.29

## 2021-04-26 HISTORY — DX: Diaphragmatic hernia without obstruction or gangrene: K44.9

## 2021-04-26 LAB — POCT I-STAT, CHEM 8
BUN: 29 mg/dL — ABNORMAL HIGH (ref 8–23)
Calcium, Ion: 1.28 mmol/L (ref 1.15–1.40)
Chloride: 104 mmol/L (ref 98–111)
Creatinine, Ser: 0.9 mg/dL (ref 0.61–1.24)
Glucose, Bld: 115 mg/dL — ABNORMAL HIGH (ref 70–99)
HCT: 42 % (ref 39.0–52.0)
Hemoglobin: 14.3 g/dL (ref 13.0–17.0)
Potassium: 3.9 mmol/L (ref 3.5–5.1)
Sodium: 138 mmol/L (ref 135–145)
TCO2: 22 mmol/L (ref 22–32)

## 2021-04-26 SURGERY — RELEASE, A1 PULLEY, FOR TRIGGER FINGER
Anesthesia: Monitor Anesthesia Care | Site: Hand | Laterality: Left

## 2021-04-26 MED ORDER — LIDOCAINE 2% (20 MG/ML) 5 ML SYRINGE
INTRAMUSCULAR | Status: AC
  Filled 2021-04-26: qty 5

## 2021-04-26 MED ORDER — LIDOCAINE 2% (20 MG/ML) 5 ML SYRINGE
INTRAMUSCULAR | Status: DC | PRN
  Administered 2021-04-26: 40 mg via INTRAVENOUS

## 2021-04-26 MED ORDER — LACTATED RINGERS IV SOLN: INTRAVENOUS | Status: DC

## 2021-04-26 MED ORDER — BUPIVACAINE HCL 0.5 % IJ SOLN
INTRAMUSCULAR | Status: DC | PRN
  Administered 2021-04-26: 5.4 mL

## 2021-04-26 MED ORDER — LIDOCAINE HCL (PF) 1 % IJ SOLN
INTRAMUSCULAR | Status: DC | PRN
  Administered 2021-04-26: 3.6 mL

## 2021-04-26 MED ORDER — HYDROCODONE-ACETAMINOPHEN 5-325 MG PO TABS: 1.0000 | ORAL_TABLET | Freq: Four times a day (QID) | ORAL | 0 refills | Status: AC | PRN

## 2021-04-26 MED ORDER — 0.9 % SODIUM CHLORIDE (POUR BTL) OPTIME
TOPICAL | Status: DC | PRN
  Administered 2021-04-26: 500 mL

## 2021-04-26 MED ORDER — PROPOFOL 500 MG/50ML IV EMUL
INTRAVENOUS | Status: AC
  Filled 2021-04-26: qty 50

## 2021-04-26 MED ORDER — ACETAMINOPHEN 500 MG PO TABS
ORAL_TABLET | ORAL | Status: AC
  Filled 2021-04-26: qty 2

## 2021-04-26 MED ORDER — PROPOFOL 10 MG/ML IV BOLUS
INTRAVENOUS | Status: DC | PRN
  Administered 2021-04-26: 40 mg via INTRAVENOUS

## 2021-04-26 MED ORDER — ACETAMINOPHEN 500 MG PO TABS
1000.0000 mg | ORAL_TABLET | Freq: Once | ORAL | Status: AC
  Administered 2021-04-26: 1000 mg via ORAL

## 2021-04-26 MED ORDER — PROPOFOL 500 MG/50ML IV EMUL
INTRAVENOUS | Status: DC | PRN
  Administered 2021-04-26: 100 ug/kg/min via INTRAVENOUS

## 2021-04-26 MED ORDER — CEFAZOLIN SODIUM-DEXTROSE 2-4 GM/100ML-% IV SOLN
INTRAVENOUS | Status: AC
  Filled 2021-04-26: qty 100

## 2021-04-26 MED ORDER — CEFAZOLIN SODIUM-DEXTROSE 2-4 GM/100ML-% IV SOLN
2.0000 g | INTRAVENOUS | Status: AC
  Administered 2021-04-26: 2 g via INTRAVENOUS

## 2021-04-26 SURGICAL SUPPLY — 33 items
BLADE SURG 15 STRL LF DISP TIS (BLADE) ×1 IMPLANT
BLADE SURG 15 STRL SS (BLADE) ×2
BNDG CMPR 9X4 STRL LF SNTH (GAUZE/BANDAGES/DRESSINGS) ×1
BNDG ELASTIC 4X5.8 VLCR STR LF (GAUZE/BANDAGES/DRESSINGS) ×2 IMPLANT
BNDG ESMARK 4X9 LF (GAUZE/BANDAGES/DRESSINGS) ×2 IMPLANT
COVER BACK TABLE 60X90IN (DRAPES) ×2 IMPLANT
CUFF TOURN SGL QUICK 18X4 (TOURNIQUET CUFF) IMPLANT
CUFF TOURN SGL QUICK 24 (TOURNIQUET CUFF)
CUFF TRNQT CYL 24X4X16.5-23 (TOURNIQUET CUFF) IMPLANT
DRAPE EXTREMITY T 121X128X90 (DISPOSABLE) ×2 IMPLANT
DRAPE SHEET LG 3/4 BI-LAMINATE (DRAPES) ×2 IMPLANT
DRAPE SURG 17X23 STRL (DRAPES) ×2 IMPLANT
GAUZE 4X4 16PLY ~~LOC~~+RFID DBL (SPONGE) ×2 IMPLANT
GAUZE SPONGE 4X4 12PLY STRL (GAUZE/BANDAGES/DRESSINGS) ×2 IMPLANT
GAUZE SPONGE 4X4 12PLY STRL LF (GAUZE/BANDAGES/DRESSINGS) ×2 IMPLANT
GAUZE XEROFORM 1X8 LF (GAUZE/BANDAGES/DRESSINGS) ×2 IMPLANT
GLOVE SURG ENC MOIS LTX SZ7.5 (GLOVE) ×2 IMPLANT
GLOVE SURG UNDER POLY LF SZ7 (GLOVE) ×2 IMPLANT
GOWN STRL REUS W/ TWL LRG LVL3 (GOWN DISPOSABLE) ×1 IMPLANT
GOWN STRL REUS W/TWL LRG LVL3 (GOWN DISPOSABLE) ×2
HIBICLENS CHG 4% 4OZ (MISCELLANEOUS) ×2 IMPLANT
NEEDLE HYPO 25X1 1.5 SAFETY (NEEDLE) ×2 IMPLANT
NS IRRIG 1000ML POUR BTL (IV SOLUTION) IMPLANT
NS IRRIG 500ML POUR BTL (IV SOLUTION) ×2 IMPLANT
PACK BASIN DAY SURGERY FS (CUSTOM PROCEDURE TRAY) ×2 IMPLANT
PAD CAST 4YDX4 CTTN HI CHSV (CAST SUPPLIES) ×1 IMPLANT
PADDING CAST COTTON 4X4 STRL (CAST SUPPLIES) ×2
SUT ETHILON 4 0 PS 2 18 (SUTURE) IMPLANT
SYR 10ML LL (SYRINGE) ×2 IMPLANT
SYR BULB EAR ULCER 3OZ GRN STR (SYRINGE) ×2 IMPLANT
TOWEL OR 17X26 10 PK STRL BLUE (TOWEL DISPOSABLE) ×2 IMPLANT
TRAY DSU PREP LF (CUSTOM PROCEDURE TRAY) ×2 IMPLANT
UNDERPAD 30X36 HEAVY ABSORB (UNDERPADS AND DIAPERS) ×2 IMPLANT

## 2021-04-26 NOTE — Discharge Instructions (Addendum)
Orthopaedic Hand Surgery Discharge Instructions  WEIGHT BEARING STATUS: Non weight bearing on operative extremity  INCISION CARE: Keep dressing over your incision clean and dry until 5 days after surgery. You may shower by placing a waterproof covering over your dressing. Once dressing is removed, you may allow water to run over the incision and then place Band-Aids over incision. Do not scrub your incision or apply creams/lotions. Do not submerge your incision or swim for 3 weeks after surgery. Contact your surgeon or primary care doctor if you develop redness or drainage from your incision.   PAIN CONTROL: First line medications for post operative pain control are Tylenol (acetaminophen) and Motrin (ibuprofen) if you are able to take these medications. If you have been prescribed a medication these can be taken as breakthrough pain medications. Please note that some narcotic pain medication have acetaminophen added and you should never consume more than 4,000mg  of acetaminophen in 24 hour period. Also please note that if you are given Toradol (ketoralac) you should not take similar medications simultaneously such as ibuprofen.   ICE/ELEVATION: Ice and elevate your injured extremity as needed. Avoid direct contact of ice with skin.  HOME MEDICATIONS: No changes have been made to your home medications.  FOLLOW UP: You will be called after surgery with an appointment date and time, however if you have not received a phone call within 3 days please call during regular office hours at [phone] to schedule a post operative appointment.  Please Seek Medical Attention if: Call MD for: pain or pressure in chest, jaw, arm, back, neck  Call MD for: temperature greater than 101 F for more than 24 hours  Call MD for: difficulty breathing Call MD for: Incision redness, bleeding, drainage  Call MD for: palpitations or feeling that the heart is racing  Call MD for: increased swelling in arm, leg, ankle,  or abdomen  Call MD for: lightheadedness, dizziness, fainting Go to ED or call 911 if: chest pain does not go away after 3 nitroglycerin doses taken 5 min apart  Go to ED or call 911 for: any uncontrolled bleeding  Go to ED or call 911 if: unable to reach physician  Discharge Medications: Allergies as of 04/26/2021   No Known Allergies      Medication List     TAKE these medications    acetaminophen 500 MG tablet Commonly known as: TYLENOL Take 500 mg by mouth every 6 (six) hours as needed. Per pt will take one tablet daily in evening   allopurinol 300 MG tablet Commonly known as: ZYLOPRIM Take 300 mg by mouth daily.   amLODipine 10 MG tablet Commonly known as: NORVASC Take 10 mg by mouth daily.   atorvastatin 20 MG tablet Commonly known as: LIPITOR Take 20 mg by mouth daily.   BLUE GEL EX Apply topically as needed.   carvedilol 6.25 MG tablet Commonly known as: COREG Take 6.25 mg by mouth daily.   CoQ-10 100 MG Caps Take 1 capsule by mouth at bedtime.   DRY EYE RELIEF OP Apply to eye as needed.   esomeprazole 20 MG capsule Commonly known as: NEXIUM Take 20 mg by mouth daily at 12 noon.   HYDROcodone-acetaminophen 5-325 MG tablet Commonly known as: NORCO/VICODIN Take 1 tablet by mouth every 6 (six) hours as needed for up to 3 days for moderate pain. What changed:  when to take this reasons to take this   Melatonin 10 MG Caps Take 1 capsule by mouth at bedtime.  meloxicam 15 MG tablet Commonly known as: MOBIC Take 15 mg by mouth every evening.   methylphenidate 10 MG tablet Commonly known as: RITALIN Take 1 tablet (10 mg total) by mouth daily.   Potassium 99 MG Tabs Take 1 tablet by mouth daily.   sertraline 100 MG tablet Commonly known as: ZOLOFT Take 100 mg by mouth at bedtime.   Vitamin D3 75 MCG (3000 UT) Tabs Take 1 tablet by mouth daily.   vitamin E 1000 UNIT capsule Take 400 Units by mouth daily.          Mathis Dad,  MD Orthopaedic Hand Surgeon [facility] Office number: [phone] [address], [address] [place], [place] [address]  Post Anesthesia Home Care Instructions  Activity: Get plenty of rest for the remainder of the day. A responsible adult should stay with you for 24 hours following the procedure.  For the next 24 hours, DO NOT: -Drive a car -Advertising copywriter -Drink alcoholic beverages -Take any medication unless instructed by your physician -Make any legal decisions or sign important papers.  Meals: Start with liquid foods such as gelatin or soup. Progress to regular foods as tolerated. Avoid greasy, spicy, heavy foods. If nausea and/or vomiting occur, drink only clear liquids until the nausea and/or vomiting subsides. Call your physician if vomiting continues.  Special Instructions/Symptoms: Your throat may feel dry or sore from the anesthesia or the breathing tube placed in your throat during surgery. If this causes discomfort, gargle with warm salt water. The discomfort should disappear within 24 hours.  If you had a scopolamine patch placed behind your ear for the management of post- operative nausea and/or vomiting:  1. The medication in the patch is effective for 72 hours, after which it should be removed.  Wrap patch in a tissue and discard in the trash. Wash hands thoroughly with soap and water. 2. You may remove the patch earlier than 72 hours if you experience unpleasant side effects which may include dry mouth, dizziness or visual disturbances. 3. Avoid touching the patch. Wash your hands with soap and water after contact with the patch.

## 2021-04-26 NOTE — Op Note (Signed)
OPERATIVE NOTE  DATE OF PROCEDURE: 04/26/2021  SURGEONS:  Primary: Orene Desanctis, MD  PREOPERATIVE DIAGNOSIS: left trigger finger  POSTOPERATIVE DIAGNOSIS: Same  NAME OF PROCEDURE:   Left ring trigger finger release  ANESTHESIA: Monitor Anesthesia Care + Local  SKIN PREPARATION: Hibiclens  ESTIMATED BLOOD LOSS: Minimal  IMPLANTS: none  INDICATIONS:  Stephen York is a 70 y.o. male who has the above preoperative diagnosis. The patient has decided to proceed with surgical intervention.  Risks, benefits and alternatives of operative management were discussed including, but not limited to, risks of anesthesia complications, infection, pain, persistent symptoms, stiffness, need for future surgery.  The patient understands, agrees and elects to proceed with surgery.    DESCRIPTION OF PROCEDURE: The patient was met in the pre-operative area and their identity was verified.  The operative location and laterality was also verified and marked.  The patient was brought to the OR and was placed supine on the table.  After repeat patient identification with the operative team anesthesia was provided and the patient was prepped and draped in the usual sterile fashion.  A final timeout was performed verifying the correction patient, procedure, location and laterality.  Preoperative antibiotics were provided and then the left upper extremity was elevated and exsanguinated with an Esmarch and tourniquet inflated to 250 mmHg.  A transverse incision was made over the A1 pulley of the left ring finger.  Skin subcutaneous tissues were divided and the neurovascular structures were protected.  The A1 pulley was identified and released completely.  The wound was thoroughly irrigated and 2 horizontal mattress 4-0 nylon sutures were placed in interrupted fashion.  A sterile soft bandage was applied.  The tourniquet was deflated and the fingers were pink and warm and well-perfused.  The patient was awoken and brought the  fingers through active range of motion with no further locking or catching.  The patient tolerated the procedure well was brought to the PACU for recovery in stable condition.   Matt Holmes, MD

## 2021-04-26 NOTE — Anesthesia Preprocedure Evaluation (Addendum)
Anesthesia Evaluation  Patient identified by MRN, date of birth, ID band Patient awake    Reviewed: Allergy & Precautions, NPO status , Patient's Chart, lab work & pertinent test results  Airway Mallampati: II  TM Distance: >3 FB Neck ROM: Full    Dental  (+)    Pulmonary sleep apnea , former smoker,    Pulmonary exam normal breath sounds clear to auscultation       Cardiovascular Exercise Tolerance: Good hypertension, Pt. on medications and Pt. on home beta blockers Normal cardiovascular exam Rhythm:Regular Rate:Normal     Neuro/Psych PSYCHIATRIC DISORDERS Anxiety negative neurological ROS     GI/Hepatic Neg liver ROS, hiatal hernia, GERD  Medicated and Controlled,  Endo/Other  negative endocrine ROS  Renal/GU negative Renal ROS  negative genitourinary   Musculoskeletal  (+) Arthritis ,   Abdominal (+) + obese,   Peds negative pediatric ROS (+)  Hematology negative hematology ROS (+)   Anesthesia Other Findings   Reproductive/Obstetrics negative OB ROS                            Anesthesia Physical Anesthesia Plan  ASA: 3  Anesthesia Plan: MAC   Post-op Pain Management:    Induction: Intravenous  PONV Risk Score and Plan: 1 and Treatment may vary due to age or medical condition, Propofol infusion and TIVA  Airway Management Planned:   Additional Equipment: None  Intra-op Plan:   Post-operative Plan:   Informed Consent: I have reviewed the patients History and Physical, chart, labs and discussed the procedure including the risks, benefits and alternatives for the proposed anesthesia with the patient or authorized representative who has indicated his/her understanding and acceptance.     Dental advisory given  Plan Discussed with: CRNA and Anesthesiologist  Anesthesia Plan Comments: (Local by surgeon. Propofol gtt. GA/LMA as backup plan. Norton Blizzard, MD  \)        Anesthesia Quick Evaluation

## 2021-04-26 NOTE — Transfer of Care (Signed)
Immediate Anesthesia Transfer of Care Note  Patient: Stephen York  Procedure(s) Performed: Procedure(s) (LRB): RELEASE TRIGGER FINGER/A-1 PULLEY (Left)  Patient Location: Phase 2  Anesthesia Type: MAC  Level of Consciousness: awake, alert , oriented and patient cooperative  Airway & Oxygen Therapy: Patient Spontanous Breathing on room air  Post-op Assessment: Report given to Phase 2 RN and Post -op Vital signs reviewed and stable  Post vital signs: Reviewed and stable  Complications: No apparent anesthesia complications  Last Vitals:  Vitals Value Taken Time  BP    Temp    Pulse    Resp    SpO2      Last Pain:  Vitals:   04/26/21 0649  TempSrc: Oral  PainSc: 0-No pain         Complications: No notable events documented.

## 2021-04-26 NOTE — Interval H&P Note (Signed)
No change since H&P. Plan for left ring finger trigger release.  Mathis Dad, MD Orthopaedic Hand Surgeon [facility] Office number: [phone] [address], [address] [place], [place] [address]

## 2021-04-26 NOTE — Anesthesia Postprocedure Evaluation (Signed)
Anesthesia Post Note  Patient: Rahmon Heigl Langhans  Procedure(s) Performed: RELEASE TRIGGER FINGER/A-1 PULLEY (Left: Hand)     Patient location during evaluation: PACU Anesthesia Type: MAC Level of consciousness: awake and alert Pain management: pain level controlled Vital Signs Assessment: post-procedure vital signs reviewed and stable Respiratory status: spontaneous breathing, nonlabored ventilation and respiratory function stable Cardiovascular status: stable and blood pressure returned to baseline Postop Assessment: no apparent nausea or vomiting Anesthetic complications: no   No notable events documented.  Last Vitals:  Vitals:   04/26/21 0919 04/26/21 0932  BP:  116/82  Pulse:  (!) 59  Resp:  17  Temp: 36.6 C   SpO2:  99%    Last Pain:  Vitals:   04/26/21 0932  TempSrc:   PainSc: 0-No pain                 Merlinda Frederick

## 2021-04-27 ENCOUNTER — Encounter (HOSPITAL_BASED_OUTPATIENT_CLINIC_OR_DEPARTMENT_OTHER): Payer: Self-pay | Admitting: Orthopedic Surgery

## 2021-05-03 ENCOUNTER — Ambulatory Visit: Payer: Medicare Other | Admitting: Family Medicine

## 2021-05-15 DIAGNOSIS — G4733 Obstructive sleep apnea (adult) (pediatric): Secondary | ICD-10-CM | POA: Diagnosis not present

## 2021-05-16 DIAGNOSIS — I10 Essential (primary) hypertension: Secondary | ICD-10-CM | POA: Diagnosis not present

## 2021-05-16 DIAGNOSIS — G894 Chronic pain syndrome: Secondary | ICD-10-CM | POA: Diagnosis not present

## 2021-05-16 DIAGNOSIS — Z8739 Personal history of other diseases of the musculoskeletal system and connective tissue: Secondary | ICD-10-CM | POA: Diagnosis not present

## 2021-05-16 DIAGNOSIS — E7800 Pure hypercholesterolemia, unspecified: Secondary | ICD-10-CM | POA: Diagnosis not present

## 2021-05-16 DIAGNOSIS — G4733 Obstructive sleep apnea (adult) (pediatric): Secondary | ICD-10-CM | POA: Diagnosis not present

## 2021-06-11 DIAGNOSIS — M79645 Pain in left finger(s): Secondary | ICD-10-CM | POA: Diagnosis not present

## 2021-06-15 DIAGNOSIS — G4733 Obstructive sleep apnea (adult) (pediatric): Secondary | ICD-10-CM | POA: Diagnosis not present

## 2021-06-21 DIAGNOSIS — G4733 Obstructive sleep apnea (adult) (pediatric): Secondary | ICD-10-CM | POA: Diagnosis not present

## 2021-07-04 DIAGNOSIS — I10 Essential (primary) hypertension: Secondary | ICD-10-CM | POA: Diagnosis not present

## 2021-07-04 DIAGNOSIS — E782 Mixed hyperlipidemia: Secondary | ICD-10-CM | POA: Diagnosis not present

## 2021-07-11 ENCOUNTER — Telehealth: Payer: Self-pay | Admitting: Adult Health

## 2021-07-11 NOTE — Telephone Encounter (Signed)
Pt contacted [facility], was instructed to contact physician to send prescription to increase pressure on CPAP machine. Fax number to send prescription: [phone]. Would like a call from the nurse to know when prescription has been sent.

## 2021-07-11 NOTE — Telephone Encounter (Signed)
I called pt and he will bring his SD card tomorrow for [place] to DL information.  He is having issues with over one month now, of machine turing off and on, quitting?  Has a lot of pressure.  DME told him to see [place].  Will see what his DL is Crista Curb is tagged.  Will come 07-12-2021 to see what it says.  Appreicated call back.

## 2021-07-12 NOTE — Telephone Encounter (Signed)
Spoke with the patient and relayed the message, per Aundra Millet NP, that his download looks great.  She has not made any suggestions of anything that needs to be changed on the prescription.  The patient was very appreciative.  He said he would take his machine by his DME company tomorrow to have it looked at.  He will call us if we can be of any further assistance.

## 2021-07-12 NOTE — Telephone Encounter (Signed)
Pt brought SD card to office. Download complete. See report below.

## 2021-07-16 DIAGNOSIS — G4733 Obstructive sleep apnea (adult) (pediatric): Secondary | ICD-10-CM | POA: Diagnosis not present

## 2021-07-28 ENCOUNTER — Other Ambulatory Visit: Payer: Self-pay | Admitting: Neurology

## 2021-07-28 DIAGNOSIS — F988 Other specified behavioral and emotional disorders with onset usually occurring in childhood and adolescence: Secondary | ICD-10-CM

## 2021-07-30 NOTE — Telephone Encounter (Signed)
Received refill request for methylphenidate.  Last OV was on 03/29/21.  Next OV is scheduled for 10/08/21 .  Last RX was written on 04/26/21 for 90 tabs.   [place] [facility] Database has been reviewed. Please fill as work in doctor, Dr. Vickey Huger currently out.

## 2021-08-16 DIAGNOSIS — G4733 Obstructive sleep apnea (adult) (pediatric): Secondary | ICD-10-CM | POA: Diagnosis not present

## 2021-09-13 DIAGNOSIS — G4733 Obstructive sleep apnea (adult) (pediatric): Secondary | ICD-10-CM | POA: Diagnosis not present

## 2021-09-19 DIAGNOSIS — G4733 Obstructive sleep apnea (adult) (pediatric): Secondary | ICD-10-CM | POA: Diagnosis not present

## 2021-10-03 DIAGNOSIS — R7301 Impaired fasting glucose: Secondary | ICD-10-CM | POA: Diagnosis not present

## 2021-10-03 DIAGNOSIS — M47816 Spondylosis without myelopathy or radiculopathy, lumbar region: Secondary | ICD-10-CM | POA: Diagnosis not present

## 2021-10-03 DIAGNOSIS — M549 Dorsalgia, unspecified: Secondary | ICD-10-CM | POA: Diagnosis not present

## 2021-10-03 DIAGNOSIS — I10 Essential (primary) hypertension: Secondary | ICD-10-CM | POA: Diagnosis not present

## 2021-10-08 ENCOUNTER — Ambulatory Visit: Payer: Medicare Other | Admitting: Adult Health

## 2021-10-08 ENCOUNTER — Encounter: Payer: Self-pay | Admitting: Adult Health

## 2021-10-08 VITALS — BP 117/82 | HR 87 | Ht 71.0 in | Wt 247.0 lb

## 2021-10-08 DIAGNOSIS — Z9989 Dependence on other enabling machines and devices: Secondary | ICD-10-CM | POA: Diagnosis not present

## 2021-10-08 DIAGNOSIS — G4733 Obstructive sleep apnea (adult) (pediatric): Secondary | ICD-10-CM

## 2021-10-08 DIAGNOSIS — F900 Attention-deficit hyperactivity disorder, predominantly inattentive type: Secondary | ICD-10-CM | POA: Diagnosis not present

## 2021-10-08 NOTE — Progress Notes (Signed)
? ? ?PATIENT: Stephen York ?DOB: 07-14-1950 ? ?REASON FOR VISIT: follow up ?HISTORY FROM: patient ? ? ?HISTORY OF PRESENT ILLNESS: ?Today 10/08/21: ? ?[name] is a 71 year old male with a history of obstructive sleep apnea on CPAP.  He returns today for follow-up.  His CPAP download indicates that he uses machine nightly for compliance of 100%.  He uses machine greater than 4 hours 19 out of 30 days for compliance of 63%.  His residual AHI is 5.5 on 5-20 cmH2O.  His leak in the 95th percentile is 42.2 L/min.  He does state that he has a new mask that has been working better for him.  Although he does report that some nights he wakes up in the middle of the night and the pressure is too strong.  He has continued on Ritalin daily.  He states that he is able to focus better when he uses the medication.  He did not attempt to stop it after our last visit.  He returns today for an evaluation. ? ?03/29/21:[name] is a 71 year old male with a history of obstructive sleep apnea on CPAP.  The patient returns today for his initial CPAP visit.  His download indicates that he uses machine 29 out of 30 days for compliance of 97%.  He uses machine greater than 4 hours each night.  On average he uses his machine 8 hours.  His residual AHI is 2.9 on 5 to 20 cm of water.  He does not have a significant leak.  His pressure in the 95th percentile is 10.9 cm of water he reports that he noticed a significant difference in how he feels.  No longer waking up with headaches.  He has been taking Ritalin daily for ADD.  However he is considering doing a trial off the medication now that he is on CPAP. ? ? ?REVIEW OF SYSTEMS: Out of a complete 14 system review of symptoms, the patient complains only of the following symptoms, and all other reviewed systems are negative. ? ?FSS 9 ?ESS 0 ? ?ALLERGIES: ?No Known Allergies ? ? ?HOME MEDICATIONS: ?Outpatient Medications Prior to Visit  ?Medication Sig Dispense Refill  ? acetaminophen  (TYLENOL) 500 MG tablet Take 500 mg by mouth every 6 (six) hours as needed. Per pt will take one tablet daily in evening    ? allopurinol (ZYLOPRIM) 300 MG tablet Take 300 mg by mouth daily.    ? amLODipine (NORVASC) 10 MG tablet Take 10 mg by mouth daily.    ? atorvastatin (LIPITOR) 20 MG tablet Take 20 mg by mouth daily.    ? Carboxymethylcellulose Sodium (DRY EYE RELIEF OP) Apply to eye as needed.    ? carvedilol (COREG) 6.25 MG tablet Take 6.25 mg by mouth daily.    ? Cholecalciferol (VITAMIN D3) 75 MCG (3000 UT) TABS Take 1 tablet by mouth daily.    ? Coenzyme Q10 (COQ-10) 100 MG CAPS Take 1 capsule by mouth at bedtime.    ? doxycycline (MONODOX) 100 MG capsule TAKE ONE CAPSULE BY MOUTH DAILY EVER MORNING WITH FOOD FOR ROSACEA    ? esomeprazole (NEXIUM) 20 MG capsule Take 20 mg by mouth daily at 12 noon.    ? Melatonin 10 MG CAPS Take 1 capsule by mouth at bedtime.    ? meloxicam (MOBIC) 15 MG tablet Take 15 mg by mouth every evening.    ? Menthol, Topical Analgesic, (BLUE GEL EX) Apply topically as needed.    ? methylphenidate (RITALIN) 10  MG tablet TAKE ONE TABLET ONCE DAILY 90 tablet 0  ? Potassium 99 MG TABS Take 1 tablet by mouth daily.    ? sertraline (ZOLOFT) 100 MG tablet Take 100 mg by mouth at bedtime.    ? vitamin E 1000 UNIT capsule Take 400 Units by mouth daily.     ? ?No facility-administered medications prior to visit.  ? ? ?PAST MEDICAL HISTORY: ?Past Medical History:  ?Diagnosis Date  ? ADD (attention deficit hyperactivity disorder, inattentive type)   ? Chronic gout   ? 04-20-2021  pt stated effect bilateral great toe's,  last episode several months ago  ? Chronic low back pain   ? Chronic sinusitis   ? Degenerative disc disease, lumbar   ? GERD (gastroesophageal reflux disease)   ? Hiatal hernia   ? History of kidney stones 03/25/2011  ? passed it  ? History of substance use   ? pt stated stopped 1981 at age 69  ? Hyperlipidemia   ? Hypertension   ? followed by pcp   (04-20-2021  pt stated he  had a stress test approx. 2007, told ok)  ? OA (osteoarthritis)   ? hands, feets, shoulders, knees  ? OSA on CPAP   ? followed by dr dohmeier;   sleep study in epic 01-29-2021 severe osa uses cpap nightly  ? Personal history of alcoholism ([facility])   ? pt stated stopped 1981 at age 13  ? Pre-diabetes   ? PTSD (post-traumatic stress disorder)   ? Trigger finger, left   ? ring  ? ? ?PAST SURGICAL HISTORY: ?Past Surgical History:  ?Procedure Laterality Date  ? FOOT SURGERY Bilateral 2002  ? approx.  ;     great toe both feet for arthritis  ? KNEE ARTHROSCOPY Left 2016  ? LUMBAR FUSION  1995  ? L4-5  ? LUMBAR MICRODISCECTOMY    ? 1988  L4-5;  redo 1991 L4-5  ? SHOULDER ARTHROSCOPY Left 2008  ? approx  ? TRIGGER FINGER RELEASE Right 06/2020  ? ring finger  ? TRIGGER FINGER RELEASE Left 04/26/2021  ? Procedure: RELEASE TRIGGER FINGER/A-1 PULLEY;  Surgeon: Gomez Cleverly, MD;  Location: [facility];  Service: Orthopedics;  Laterality: Left;  with LOCAL  ? ? ?FAMILY HISTORY: ?Family History  ?Problem Relation Age of Onset  ? Aneurysm Mother   ? Alcohol abuse Father   ? Sleep apnea Sister   ? ? ?SOCIAL HISTORY: ?Social History  ? ?Socioeconomic History  ? Marital status: Married  ?  Spouse name: Samara Deist  ? Number of children: 2  ? Years of education: 69  ? Highest education level: Not on file  ?Occupational History  ? Occupation: retired  ?Tobacco Use  ? Smoking status: Former  ?  Packs/day: 2.00  ?  Years: 30.00  ?  Pack years: 60.00  ?  Types: Cigarettes  ?  Quit date: 09/29/1992  ?  Years since quitting: 29.0  ? Smokeless tobacco: Never  ?Vaping Use  ? Vaping Use: Never used  ?Substance and Sexual Activity  ? Alcohol use: Not Currently  ?  Comment: history alcoholism in remission quit in 36 (age 85)  ? Drug use: Not Currently  ?  Types: Marijuana, Mescaline, Other-see comments  ?  Comment: pt stated stopped  pt stopped 50 (age 5)  ? Sexual activity: Not on file  ?Other Topics Concern  ? Not on file  ?Social  History Narrative  ? Caffeine 3 cups in am,   ?  Married, 2 kids. Retired.    ? Right handed  ? ?Social Determinants of Health  ? ?Financial Resource Strain: Not on file  ?Food Insecurity: Not on file  ?Transportation Needs: Not on file  ?Physical Activity: Not on file  ?Stress: Not on file  ?Social Connections: Not on file  ?Intimate Partner Violence: Not on file  ? ? ? ? ?PHYSICAL EXAM ? ?Vitals:  ? 10/08/21 1412  ?BP: 117/82  ?Pulse: 87  ?Weight: 247 lb (112 kg)  ?Height: 5\' 11"  (1.803 m)  ? ?Body mass index is 34.45 kg/m?. ? ?Generalized: Well developed, in no acute distress  ?Chest: Lungs clear to auscultation bilaterally ? ?Neurological examination  ?Mentation: Alert oriented to time, place, history taking. Follows all commands speech and language fluent ?Cranial nerve II-XII: Extraocular movements were full, visual field were full on confrontational test Head turning and shoulder shrug  were normal and symmetric. ?Motor: The motor testing reveals 5 over 5 strength of all 4 extremities. Good symmetric motor tone is noted throughout.  ?Sensory: Sensory testing is intact to soft touch on all 4 extremities. No evidence of extinction is noted.  ?Gait and station: Gait is normal.  ? ? ?DIAGNOSTIC DATA (LABS, IMAGING, TESTING) ?- I reviewed patient records, labs, notes, testing and imaging myself where available. ? ?Lab Results  ?Component Value Date  ? HGB 14.3 04/26/2021  ? HCT 42.0 04/26/2021  ? ?   ?Component Value Date/Time  ? NA 138 04/26/2021 0707  ? K 3.9 04/26/2021 0707  ? CL 104 04/26/2021 0707  ? CO2 24 08/31/2018 1017  ? GLUCOSE 115 (H) 04/26/2021 0707  ? BUN 29 (H) 04/26/2021 13/08/2020  ? CREATININE 0.90 04/26/2021 0707  ? CALCIUM 9.6 08/31/2018 1017  ? GFRNONAA >60 08/31/2018 1017  ? GFRAA >60 08/31/2018 1017  ? ? ? ? ?ASSESSMENT AND PLAN ?71 y.o. year old male  has a past medical history of ADD (attention deficit hyperactivity disorder, inattentive type), Chronic gout, Chronic low back pain, Chronic  sinusitis, Degenerative disc disease, lumbar, GERD (gastroesophageal reflux disease), Hiatal hernia, History of kidney stones (03/25/2011), History of substance use, Hyperlipidemia, Hypertension, OA (osteoart

## 2021-10-08 NOTE — Patient Instructions (Signed)
Continue using CPAP nightly and greater than 4 hours each night ?Continue ritalin  ?If your symptoms worsen or you develop new symptoms please let us know.  ? ? ?

## 2021-10-09 NOTE — Progress Notes (Signed)
autoPAP pressure change order sent to [facility].  ?

## 2021-10-14 DIAGNOSIS — G4733 Obstructive sleep apnea (adult) (pediatric): Secondary | ICD-10-CM | POA: Diagnosis not present

## 2021-10-18 DIAGNOSIS — I10 Essential (primary) hypertension: Secondary | ICD-10-CM | POA: Diagnosis not present

## 2021-10-18 DIAGNOSIS — E782 Mixed hyperlipidemia: Secondary | ICD-10-CM | POA: Diagnosis not present

## 2021-10-27 ENCOUNTER — Other Ambulatory Visit: Payer: Self-pay | Admitting: Neurology

## 2021-10-27 DIAGNOSIS — F988 Other specified behavioral and emotional disorders with onset usually occurring in childhood and adolescence: Secondary | ICD-10-CM

## 2021-10-29 ENCOUNTER — Telehealth: Payer: Self-pay | Admitting: Adult Health

## 2021-10-29 NOTE — Telephone Encounter (Signed)
Last visit 10/08/21 ?Next visit 04/10/22  ?Per  registry, last filled #90/90 on 07/30/2021. Rx refill sent to [facility].  ?

## 2021-10-29 NOTE — Telephone Encounter (Signed)
Last visit 10/08/21 ?Next visit 04/10/22  ?Per [place] registry, last filled #90/90 on 07/30/2021. Rx refill sent to [facility].  ?

## 2021-10-29 NOTE — Telephone Encounter (Signed)
Pt. is requesting a refill for methylphenidate (RITALIN) 10 MG tablet.  Pharmacy: [facility] 

## 2021-11-13 DIAGNOSIS — G4733 Obstructive sleep apnea (adult) (pediatric): Secondary | ICD-10-CM | POA: Diagnosis not present

## 2021-12-18 DIAGNOSIS — G4733 Obstructive sleep apnea (adult) (pediatric): Secondary | ICD-10-CM | POA: Diagnosis not present

## 2022-01-21 DIAGNOSIS — G4733 Obstructive sleep apnea (adult) (pediatric): Secondary | ICD-10-CM | POA: Diagnosis not present

## 2022-01-23 ENCOUNTER — Other Ambulatory Visit: Payer: Self-pay | Admitting: Adult Health

## 2022-01-23 DIAGNOSIS — F988 Other specified behavioral and emotional disorders with onset usually occurring in childhood and adolescence: Secondary | ICD-10-CM

## 2022-02-23 ENCOUNTER — Other Ambulatory Visit: Payer: Self-pay | Admitting: Adult Health

## 2022-02-23 DIAGNOSIS — F988 Other specified behavioral and emotional disorders with onset usually occurring in childhood and adolescence: Secondary | ICD-10-CM

## 2022-02-27 ENCOUNTER — Telehealth: Payer: Self-pay | Admitting: Adult Health

## 2022-02-27 NOTE — Telephone Encounter (Signed)
Pt is calling . Stated he need refill sent to pharmacy today for methylphenidate (RITALIN) 10 MG tablet. Pt said he has one more pill before he is out.

## 2022-02-27 NOTE — Telephone Encounter (Signed)
[place] drug registry checked last fill 01-23-2022 #30.

## 2022-02-27 NOTE — Telephone Encounter (Signed)
Rx request was completed thru that venue.

## 2022-02-27 NOTE — Telephone Encounter (Signed)
Last ofv visit 10-08-2021.  Next 04-10-2022.

## 2022-03-18 DIAGNOSIS — G4733 Obstructive sleep apnea (adult) (pediatric): Secondary | ICD-10-CM | POA: Diagnosis not present

## 2022-03-26 ENCOUNTER — Other Ambulatory Visit: Payer: Self-pay | Admitting: Adult Health

## 2022-03-26 DIAGNOSIS — F988 Other specified behavioral and emotional disorders with onset usually occurring in childhood and adolescence: Secondary | ICD-10-CM

## 2022-04-10 ENCOUNTER — Encounter: Payer: Self-pay | Admitting: Adult Health

## 2022-04-10 ENCOUNTER — Ambulatory Visit: Payer: Medicare Other | Admitting: Adult Health

## 2022-04-10 VITALS — BP 111/78 | HR 79 | Ht 71.0 in | Wt 253.8 lb

## 2022-04-10 DIAGNOSIS — G4733 Obstructive sleep apnea (adult) (pediatric): Secondary | ICD-10-CM

## 2022-04-10 DIAGNOSIS — F900 Attention-deficit hyperactivity disorder, predominantly inattentive type: Secondary | ICD-10-CM

## 2022-04-10 NOTE — Progress Notes (Signed)
PATIENT: Stephen York DOB: 27-Apr-1951  REASON FOR VISIT: follow up HISTORY FROM: patient  Chief Complaint  Patient presents with   Follow-up    Pt in 19 Pt here for CPAP machine  Pt states no questions or concerns today       HISTORY OF PRESENT ILLNESS: Today 04/10/22:Stephen York is a 71 year old male with a history of obstructive sleep apnea on CPAP and ADD.  He returns today for follow-up. No longer feeling tired in the afternoons. Can't sleep without CPAP. Continues to take ritalin 10 mg daily. Has better concentration.  CPAP download shows that he uses machine nightly for compliance of 100%.  He uses machine greater than 4 hours each night.  On average he uses his machine 8/2 hours.  His residual AHI is 1.9.  He denies any new symptoms.  Returns today for evaluation.  10/08/2021: Stephen York is a 71 year old male with a history of obstructive sleep apnea on CPAP.  He returns today for follow-up.  His CPAP download indicates that he uses machine nightly for compliance of 100%.  He uses machine greater than 4 hours 19 out of 30 days for compliance of 63%.  His residual AHI is 5.5 on 5-20 cmH2O.  His leak in the 95th percentile is 42.2 L/min.  He does state that he has a new mask that has been working better for him.  Although he does report that some nights he wakes up in the middle of the night and the pressure is too strong.  He has continued on Ritalin daily.  He states that he is able to focus better when he uses the medication.  He did not attempt to stop it after our last visit.  He returns today for an evaluation.  03/29/21:Stephen York is a 71 year old male with a history of obstructive sleep apnea on CPAP.  The patient returns today for his initial CPAP visit.  His download indicates that he uses machine 29 out of 30 days for compliance of 97%.  He uses machine greater than 4 hours each night.  On average he uses his machine 8 hours.  His residual AHI is 2.9 on 5 to 20 cm of  water.  He does not have a significant leak.  His pressure in the 95th percentile is 10.9 cm of water he reports that he noticed a significant difference in how he feels.  No longer waking up with headaches.  He has been taking Ritalin daily for ADD.  However he is considering doing a trial off the medication now that he is on CPAP.   REVIEW OF SYSTEMS: Out of a complete 14 system review of symptoms, the patient complains only of the following symptoms, and all other reviewed systems are negative.  FSS 9 ESS 1  ALLERGIES: No Known Allergies   HOME MEDICATIONS: Outpatient Medications Prior to Visit  Medication Sig Dispense Refill   acetaminophen (TYLENOL) 500 MG tablet Take 500 mg by mouth every 6 (six) hours as needed. Per pt will take one tablet daily in evening     allopurinol (ZYLOPRIM) 300 MG tablet Take 300 mg by mouth daily.     amLODipine (NORVASC) 10 MG tablet Take 10 mg by mouth daily.     atorvastatin (LIPITOR) 20 MG tablet Take 20 mg by mouth daily.     Carboxymethylcellulose Sodium (DRY EYE RELIEF OP) Apply to eye as needed.     carvedilol (COREG) 6.25 MG tablet Take 6.25 mg by mouth daily.  Cholecalciferol (VITAMIN D3) 75 MCG (3000 UT) TABS Take 1 tablet by mouth daily.     Coenzyme Q10 (COQ-10) 100 MG CAPS Take 1 capsule by mouth at bedtime.     doxycycline (MONODOX) 100 MG capsule TAKE ONE CAPSULE BY MOUTH DAILY EVER MORNING WITH FOOD FOR ROSACEA     esomeprazole (NEXIUM) 20 MG capsule Take 20 mg by mouth daily at 12 noon.     HYDROcodone-acetaminophen (NORCO/VICODIN) 5-325 MG tablet SMARTSIG:1-2 Tablet(s) By Mouth Every 12 Hours PRN     Melatonin 10 MG CAPS Take 1 capsule by mouth at bedtime.     meloxicam (MOBIC) 15 MG tablet Take 15 mg by mouth every evening.     Menthol, Topical Analgesic, (BLUE GEL EX) Apply topically as needed.     methylphenidate (RITALIN) 10 MG tablet TAKE ONE TABLET ONCE DAILY 30 tablet 0   Potassium 99 MG TABS Take 1 tablet by mouth daily.      sertraline (ZOLOFT) 100 MG tablet Take 100 mg by mouth at bedtime.     vitamin E 1000 UNIT capsule Take 400 Units by mouth daily.      No facility-administered medications prior to visit.    PAST MEDICAL HISTORY: Past Medical History:  Diagnosis Date   ADD (attention deficit hyperactivity disorder, inattentive type)    Chronic gout    04-20-2021  pt stated effect bilateral great toe's,  last episode several months ago   Chronic low back pain    Chronic sinusitis    Degenerative disc disease, lumbar    GERD (gastroesophageal reflux disease)    Hiatal hernia    History of kidney stones 03/25/2011   passed it   History of substance use    pt stated stopped 1981 at age 83   Hyperlipidemia    Hypertension    followed by pcp   (04-20-2021  pt stated he had a stress test approx. 2007, told ok)   OA (osteoarthritis)    hands, feets, shoulders, knees   OSA on CPAP    followed by dr dohmeier;   sleep study in epic 01-29-2021 severe osa uses cpap nightly   Personal history of alcoholism ([place])    pt stated stopped 66 at age 25   Pre-diabetes    PTSD (post-traumatic stress disorder)    Trigger finger, left    ring    PAST SURGICAL HISTORY: Past Surgical History:  Procedure Laterality Date   FOOT SURGERY Bilateral 2002   approx.  ;     great toe both feet for arthritis   KNEE ARTHROSCOPY Left 2016   LUMBAR FUSION  1995   L4-5   LUMBAR MICRODISCECTOMY     1988  L4-5;  redo 1991 L4-5   SHOULDER ARTHROSCOPY Left 2008   approx   TRIGGER FINGER RELEASE Right 06/2020   ring finger   TRIGGER FINGER RELEASE Left 04/26/2021   Procedure: RELEASE TRIGGER FINGER/A-1 PULLEY;  Surgeon: Orene Desanctis, MD;  Location: [place] [facility];  Service: Orthopedics;  Laterality: Left;  with LOCAL    FAMILY HISTORY: Family History  Problem Relation Age of Onset   Aneurysm Mother    Alcohol abuse Father    Sleep apnea Sister     SOCIAL HISTORY: Social History   Socioeconomic  History   Marital status: Married    Spouse name: Curt Bears   Number of children: 2   Years of education: 12   Highest education level: Not on file  Occupational History  Occupation: retired  Tobacco Use   Smoking status: Former    Packs/day: 2.00    Years: 30.00    Total pack years: 60.00    Types: Cigarettes    Quit date: 09/29/1992    Years since quitting: 29.5   Smokeless tobacco: Never  Vaping Use   Vaping Use: Never used  Substance and Sexual Activity   Alcohol use: Not Currently    Comment: history alcoholism in remission quit in 1981 (age 4)   Drug use: Not Currently    Types: Marijuana, Mescaline, Other-see comments    Comment: pt stated stopped  pt stopped 83 (age 50)   Sexual activity: Not on file  Other Topics Concern   Not on file  Social History Narrative   Caffeine 3 cups in am,    Married, 2 kids. Retired.     Right handed   Social Determinants of Health   Financial Resource Strain: Not on file  Food Insecurity: Not on file  Transportation Needs: Not on file  Physical Activity: Not on file  Stress: Not on file  Social Connections: Not on file  Intimate Partner Violence: Not on file      PHYSICAL EXAM  Vitals:   04/10/22 1354  BP: 111/78  Pulse: 79  Weight: 253 lb 12.8 oz (115.1 kg)  Height: 5\' 11"  (1.803 m)   Body mass index is 35.4 kg/m.  Generalized: Well developed, in no acute distress  Chest: Lungs clear to auscultation bilaterally  Neurological examination  Mentation: Alert oriented to time, place, history taking. Follows all commands speech and language fluent Cranial nerve II-XII: Extraocular movements were full, visual field were full on confrontational test Head turning and shoulder shrug  were normal and symmetric. Gait and station: Gait is normal.    DIAGNOSTIC DATA (LABS, IMAGING, TESTING) - I reviewed patient records, labs, notes, testing and imaging myself where available.  Lab Results  Component Value Date   HGB  14.3 04/26/2021   HCT 42.0 04/26/2021      Component Value Date/Time   NA 138 04/26/2021 0707   K 3.9 04/26/2021 0707   CL 104 04/26/2021 0707   CO2 24 08/31/2018 1017   GLUCOSE 115 (H) 04/26/2021 0707   BUN 29 (H) 04/26/2021 0707   CREATININE 0.90 04/26/2021 0707   CALCIUM 9.6 08/31/2018 1017   GFRNONAA >60 08/31/2018 1017   GFRAA >60 08/31/2018 1017      ASSESSMENT AND PLAN 71 y.o. year old male  has a past medical history of ADD (attention deficit hyperactivity disorder, inattentive type), Chronic gout, Chronic low back pain, Chronic sinusitis, Degenerative disc disease, lumbar, GERD (gastroesophageal reflux disease), Hiatal hernia, History of kidney stones (03/25/2011), History of substance use, Hyperlipidemia, Hypertension, OA (osteoarthritis), OSA on CPAP, Personal history of alcoholism ([place]), Pre-diabetes, PTSD (post-traumatic stress disorder), and Trigger finger, left. here with:  OSA on CPAP ADHD  - CPAP compliance excellent - Good treatment of AHI  - Encourage patient to use CPAP nightly and > 4 hours each night - Continue Ritalin 10 mg daily - F/U in 6 months or sooner if needed    Ward Givens, MSN, NP-C 04/10/2022, 2:01 PM [facility] [address], [place], [place] [address] [phone]

## 2022-04-10 NOTE — Patient Instructions (Signed)
Continue using CPAP nightly and greater than 4 hours each night Continue ritalin 10 mg If your symptoms worsen or you develop new symptoms please let us know.

## 2022-04-24 DIAGNOSIS — M109 Gout, unspecified: Secondary | ICD-10-CM | POA: Diagnosis not present

## 2022-04-24 DIAGNOSIS — D649 Anemia, unspecified: Secondary | ICD-10-CM | POA: Diagnosis not present

## 2022-04-24 DIAGNOSIS — E782 Mixed hyperlipidemia: Secondary | ICD-10-CM | POA: Diagnosis not present

## 2022-04-24 DIAGNOSIS — R7303 Prediabetes: Secondary | ICD-10-CM | POA: Diagnosis not present

## 2022-04-24 DIAGNOSIS — G894 Chronic pain syndrome: Secondary | ICD-10-CM | POA: Diagnosis not present

## 2022-04-24 DIAGNOSIS — M549 Dorsalgia, unspecified: Secondary | ICD-10-CM | POA: Diagnosis not present

## 2022-04-25 ENCOUNTER — Other Ambulatory Visit: Payer: Self-pay | Admitting: Adult Health

## 2022-04-25 DIAGNOSIS — F988 Other specified behavioral and emotional disorders with onset usually occurring in childhood and adolescence: Secondary | ICD-10-CM

## 2022-04-25 NOTE — Telephone Encounter (Signed)
Last visit 04/10/22 Next visit 10/24/22 Per [place] registry, last filled on 03/28/2022 #30/30. Rx refill sent to [facility].

## 2022-04-26 DIAGNOSIS — G4733 Obstructive sleep apnea (adult) (pediatric): Secondary | ICD-10-CM | POA: Diagnosis not present

## 2022-05-23 ENCOUNTER — Other Ambulatory Visit: Payer: Self-pay | Admitting: Adult Health

## 2022-05-23 DIAGNOSIS — L600 Ingrowing nail: Secondary | ICD-10-CM | POA: Diagnosis not present

## 2022-05-23 DIAGNOSIS — M79675 Pain in left toe(s): Secondary | ICD-10-CM | POA: Diagnosis not present

## 2022-05-23 DIAGNOSIS — F988 Other specified behavioral and emotional disorders with onset usually occurring in childhood and adolescence: Secondary | ICD-10-CM

## 2022-05-23 DIAGNOSIS — L03031 Cellulitis of right toe: Secondary | ICD-10-CM | POA: Diagnosis not present

## 2022-05-28 ENCOUNTER — Telehealth: Payer: Self-pay | Admitting: Adult Health

## 2022-05-28 NOTE — Telephone Encounter (Signed)
Pt is calling. Wanting to know if his prescription for methylphenidate (RITALIN) 10 MG tablet will be sent to the pharmacy today. Pt said he took last pill today.

## 2022-05-28 NOTE — Telephone Encounter (Signed)
Last visit 04/10/22 Next visit 10/24/22 Per [place] registry, patient last filled on 04/27/2022 #30/30. Rx request sent to [facility].

## 2022-05-28 NOTE — Telephone Encounter (Signed)
Rx request sent to provider

## 2022-06-03 DIAGNOSIS — I10 Essential (primary) hypertension: Secondary | ICD-10-CM | POA: Diagnosis not present

## 2022-06-03 DIAGNOSIS — G894 Chronic pain syndrome: Secondary | ICD-10-CM | POA: Diagnosis not present

## 2022-06-03 DIAGNOSIS — E782 Mixed hyperlipidemia: Secondary | ICD-10-CM | POA: Diagnosis not present

## 2022-06-18 DIAGNOSIS — G4733 Obstructive sleep apnea (adult) (pediatric): Secondary | ICD-10-CM | POA: Diagnosis not present

## 2022-06-26 ENCOUNTER — Other Ambulatory Visit: Payer: Self-pay | Admitting: Adult Health

## 2022-06-26 DIAGNOSIS — F988 Other specified behavioral and emotional disorders with onset usually occurring in childhood and adolescence: Secondary | ICD-10-CM

## 2022-06-27 NOTE — Telephone Encounter (Signed)
Pt last seen 04-10-2022, next appt 10-24-2022

## 2022-07-05 ENCOUNTER — Encounter: Payer: Self-pay | Admitting: Gastroenterology

## 2022-07-25 ENCOUNTER — Other Ambulatory Visit: Payer: Self-pay | Admitting: Adult Health

## 2022-07-25 DIAGNOSIS — F988 Other specified behavioral and emotional disorders with onset usually occurring in childhood and adolescence: Secondary | ICD-10-CM

## 2022-07-26 ENCOUNTER — Ambulatory Visit (AMBULATORY_SURGERY_CENTER): Payer: Medicare Other

## 2022-07-26 VITALS — Ht 71.0 in | Wt 246.0 lb

## 2022-07-26 DIAGNOSIS — Z1211 Encounter for screening for malignant neoplasm of colon: Secondary | ICD-10-CM

## 2022-07-26 NOTE — Progress Notes (Unsigned)
Pre visit completed via phone call; Patient verified name, DOB, and address;  No egg or soy allergy known to patient  No issues known to pt with past sedation with any surgeries or procedures Patient denies ever being told they had issues or difficulty with intubation  No FH of Malignant Hyperthermia Pt is not on diet pills Pt is not on home 02  Pt is not on blood thinners  Pt denies issues with constipation at this time; No A fib or A flutter Have any cardiac testing pending--NO Pt instructed to use [email or web address] or GoodRx for a price reduction on prep   Insurance verified during [place] appt=[employer] Medicare  Patient's chart reviewed by Osvaldo Angst CNRA prior to previsit and patient appropriate for the [place].  Previsit completed and red dot placed by patient's name on their procedure day (on provider's schedule).    Patient reports he has a prep at home that the [place] sent him- he reports he will come to the office and show the [place] RN the prep as he is unable to read the prep name- he is requesting instructions not be sent to him until he shows the prep to the office- RX not sent in to the pharmacy during [facility] appt; Amb ref. Sent during [place];  Instructions to be printed and given to the patient when he brings the prep in to the office;

## 2022-07-29 DIAGNOSIS — G894 Chronic pain syndrome: Secondary | ICD-10-CM | POA: Diagnosis not present

## 2022-07-29 DIAGNOSIS — Z9989 Dependence on other enabling machines and devices: Secondary | ICD-10-CM | POA: Diagnosis not present

## 2022-07-29 DIAGNOSIS — I10 Essential (primary) hypertension: Secondary | ICD-10-CM | POA: Diagnosis not present

## 2022-07-30 DIAGNOSIS — Z0000 Encounter for general adult medical examination without abnormal findings: Secondary | ICD-10-CM | POA: Diagnosis not present

## 2022-07-30 MED ORDER — NA SULFATE-K SULFATE-MG SULF 17.5-3.13-1.6 GM/177ML PO SOLN: 1.0000 | Freq: Once | ORAL | 0 refills | Status: AC

## 2022-07-30 NOTE — Progress Notes (Signed)
Patient came into office and prep instructions were reviewed in detail with patient; all questions were answered during additional time; Suprep Rx sent to pharmacy of choice ([facility]);  Patient advised to call back to the office at [phone] should questions/concerns arise; Patient verbalized understanding of information/instructions;

## 2022-08-07 ENCOUNTER — Encounter: Payer: Self-pay | Admitting: Gastroenterology

## 2022-08-20 ENCOUNTER — Other Ambulatory Visit: Payer: Self-pay | Admitting: Adult Health

## 2022-08-20 ENCOUNTER — Telehealth: Payer: Self-pay | Admitting: Gastroenterology

## 2022-08-20 DIAGNOSIS — F988 Other specified behavioral and emotional disorders with onset usually occurring in childhood and adolescence: Secondary | ICD-10-CM

## 2022-08-20 NOTE — Telephone Encounter (Signed)
Called the patient back told him just to hold his Allopurinol tonight since he isn't eating solid food today and that he can resume this tomorrow after his procedure. PT verbalized understanding.

## 2022-08-20 NOTE — Telephone Encounter (Signed)
Inbound call from patient, states he has a procedure tomorrow, patient is taking Allopurrinol for gout, patient states he usually takes the medication with a meal for dinner. Patient is wondering if he should still take medication due to him being on his liquid diet today. Please advise.

## 2022-08-21 ENCOUNTER — Encounter: Payer: Self-pay | Admitting: Gastroenterology

## 2022-08-21 ENCOUNTER — Ambulatory Visit (AMBULATORY_SURGERY_CENTER): Payer: Medicare Other | Admitting: Gastroenterology

## 2022-08-21 VITALS — BP 116/77 | HR 84 | Temp 98.4°F | Resp 17 | Ht 71.0 in | Wt 246.0 lb

## 2022-08-21 DIAGNOSIS — Z1211 Encounter for screening for malignant neoplasm of colon: Secondary | ICD-10-CM

## 2022-08-21 MED ORDER — SODIUM CHLORIDE 0.9 % IV SOLN: 500.0000 mL | Freq: Once | INTRAVENOUS | Status: DC

## 2022-08-21 NOTE — Progress Notes (Signed)
History and Physical:  This patient presents for endoscopic testing for: Encounter Diagnosis  Name Primary?   Colon cancer screening Yes    Average risk for CRC - last colonoscopy in 2010 without polyps Patient denies chronic abdominal pain, rectal bleeding, constipation or diarrhea.   Patient is otherwise without complaints or active issues today.   Past Medical History: Past Medical History:  Diagnosis Date   ADD (attention deficit hyperactivity disorder, inattentive type)    Anemia    on iron at this time (07/26/2022)   Chronic gout    04-20-2021  pt stated effect bilateral great toe's,  last episode several months ago   Chronic low back pain    Chronic sinusitis    Degenerative disc disease, lumbar    lower back, nack, bilateral kness   GERD (gastroesophageal reflux disease)    Hiatal hernia    History of kidney stones 03/25/2011   passed it   History of substance use    pt stated stopped 1981 at age 58   Hyperlipidemia    on meds   Hypertension    followed by pcp   (04-20-2021  pt stated he had a stress test approx. 2007, told ok)-on meds   OA (osteoarthritis)    hands, feets, shoulders, knees   OSA on CPAP    followed by dr dohmeier;   sleep study in epic 01-29-2021 severe osa uses cpap nightly   Personal history of alcoholism ([place])    pt stated stopped 1981 at age 61   Pre-diabetes    PTSD (post-traumatic stress disorder)    Seasonal allergies    Trigger finger, left    ring     Past Surgical History: Past Surgical History:  Procedure Laterality Date   FOOT SURGERY Bilateral 2002   approx.  ;     great toe both feet for arthritis   KNEE ARTHROSCOPY Left 2016   LUMBAR FUSION  1995   L4-5   LUMBAR MICRODISCECTOMY     1988  L4-5;  redo 1991 L4-5   SHOULDER ARTHROSCOPY Left 2008   approx   TRIGGER FINGER RELEASE Right 06/2020   ring finger   TRIGGER FINGER RELEASE Left 04/26/2021   Procedure: RELEASE TRIGGER FINGER/A-1 PULLEY;  Surgeon: Orene Desanctis, MD;  Location: [facility];  Service: Orthopedics;  Laterality: Left;  with LOCAL   WISDOM TOOTH EXTRACTION      Allergies: No Known Allergies  Outpatient Meds: Current Outpatient Medications  Medication Sig Dispense Refill   acetaminophen (TYLENOL) 500 MG tablet Take 500 mg by mouth every 6 (six) hours as needed. Per pt will take one tablet daily in evening     allopurinol (ZYLOPRIM) 300 MG tablet Take 300 mg by mouth daily.     amLODipine (NORVASC) 10 MG tablet Take 10 mg by mouth daily.     Ascorbic Acid (VITAMIN C PO) Take 1 tablet by mouth daily at 6 (six) AM.     atorvastatin (LIPITOR) 40 MG tablet Take 40 mg by mouth daily.     BIOTIN PO Take 5,000 mg by mouth daily at 6 (six) AM.     Carboxymethylcellulose Sodium (DRY EYE RELIEF OP) Apply to eye as needed.     carvedilol (COREG) 6.25 MG tablet Take 6.25 mg by mouth daily.     Cholecalciferol (VITAMIN D3) 75 MCG (3000 UT) TABS Take 1 tablet by mouth daily.     Coenzyme Q10 (COQ-10) 100 MG CAPS Take 1 capsule by  mouth at bedtime.     diphenhydrAMINE (BENADRYL) 25 mg capsule Take 25 mg by mouth at bedtime as needed for sleep.     diphenhydramine-acetaminophen (TYLENOL PM) 25-500 MG TABS tablet Take 1 tablet by mouth at bedtime as needed.     doxycycline (MONODOX) 100 MG capsule TAKE ONE CAPSULE BY MOUTH DAILY EVER MORNING WITH FOOD FOR ROSACEA     esomeprazole (NEXIUM) 20 MG capsule Take 20 mg by mouth daily at 12 noon.     HYDROcodone-acetaminophen (NORCO/VICODIN) 5-325 MG tablet Take 1 tablet by mouth every 12 (twelve) hours as needed.     lisinopril (ZESTRIL) 40 MG tablet Take 40 mg by mouth daily.     methocarbamol (ROBAXIN) 500 MG tablet Take 500 mg by mouth every 12 (twelve) hours.     methylphenidate (RITALIN) 10 MG tablet TAKE ONE TABLET ONCE DAILY 30 tablet 0   metroNIDAZOLE (METROGEL) 1 % gel Apply 1 Application topically daily.     naloxone (NARCAN) nasal spray 4 mg/0.1 mL Place 1 spray into the nose  as needed.     Omega-3 Fatty Acids (FISH OIL) 1000 MG CAPS Take 2 capsules by mouth 2 (two) times daily.     Potassium 99 MG TABS Take 1 tablet by mouth daily.     sertraline (ZOLOFT) 100 MG tablet Take 100 mg by mouth at bedtime.     vitamin E 1000 UNIT capsule Take 400 Units by mouth daily.      ferrous sulfate 325 (65 FE) MG tablet Take 325 mg by mouth daily with breakfast. (Patient not taking: Reported on 08/21/2022)     MAGNESIUM PO Take 20 mg by mouth daily at 6 (six) AM.     Melatonin 10 MG CAPS Take 1 capsule by mouth at bedtime.     Menthol, Topical Analgesic, (BLUE GEL EX) Apply topically as needed.     mupirocin ointment (BACTROBAN) 2 % Apply 1 Application topically 2 (two) times daily.     Current Facility-Administered Medications  Medication Dose Route Frequency Provider Last Rate Last Admin   0.9 %  sodium chloride infusion  500 mL Intravenous Once Doran Stabler, MD          ___________________________________________________________________ Objective   Exam:  BP 111/63   Pulse 84   Temp 98.4 F (36.9 C)   Ht '5\' 11"'$  (1.803 m)   Wt 246 lb (111.6 kg)   SpO2 97%   BMI 34.31 kg/m   CV: regular , S1/S2 Resp: clear to auscultation bilaterally, normal RR and effort noted GI: soft, no tenderness, with active bowel sounds.   Assessment: Encounter Diagnosis  Name Primary?   Colon cancer screening Yes     Plan: Colonoscopy  The benefits and risks of the planned procedure were described in detail with the patient or (when appropriate) their health care proxy.  Risks were outlined as including, but not limited to, bleeding, infection, perforation, adverse medication reaction leading to cardiac or pulmonary decompensation, pancreatitis (if ERCP).  The limitation of incomplete mucosal visualization was also discussed.  No guarantees or warranties were given.    The patient is appropriate for an endoscopic procedure in the ambulatory setting.   - Wilfrid Lund,  MD

## 2022-08-21 NOTE — Patient Instructions (Signed)
YOU HAD AN ENDOSCOPIC PROCEDURE TODAY AT THE [facility]:   Refer to the procedure report that was given to you for any specific questions about what was found during the examination.  If the procedure report does not answer your questions, please call your gastroenterologist to clarify.  If you requested that your care partner not be given the details of your procedure findings, then the procedure report has been included in a sealed envelope for you to review at your convenience later.  YOU SHOULD EXPECT: Some feelings of bloating in the abdomen. Passage of more gas than usual.  Walking can help get rid of the air that was put into your GI tract during the procedure and reduce the bloating. If you had a lower endoscopy (such as a colonoscopy or flexible sigmoidoscopy) you may notice spotting of blood in your stool or on the toilet paper. If you underwent a bowel prep for your procedure, you may not have a normal bowel movement for a few days.  Please Note:  You might notice some irritation and congestion in your nose or some drainage.  This is from the oxygen used during your procedure.  There is no need for concern and it should clear up in a day or so.  SYMPTOMS TO REPORT IMMEDIATELY:  Following lower endoscopy (colonoscopy or flexible sigmoidoscopy):  Excessive amounts of blood in the stool  Significant tenderness or worsening of abdominal pains  Swelling of the abdomen that is new, acute  Fever of 100F or higher  For urgent or emergent issues, a gastroenterologist can be reached at any hour by calling [phone]. Do not use MyChart messaging for urgent concerns.    DIET:  We do recommend a small meal at first, but then you may proceed to your regular diet.  Drink plenty of fluids but you should avoid alcoholic beverages for 24 hours.  ACTIVITY:  You should plan to take it easy for the rest of today and you should NOT DRIVE or use heavy machinery until tomorrow (because of  the sedation medicines used during the test).    FOLLOW UP: Our staff will call the number listed on your records the next business day following your procedure.  We will call around 7:15- 8:00 am to check on you and address any questions or concerns that you may have regarding the information given to you following your procedure. If we do not reach you, we will leave a message.     If any biopsies were taken you will be contacted by phone or by letter within the next 1-3 weeks.  Please call us at [phone] if you have not heard about the biopsies in 3 weeks.    SIGNATURES/CONFIDENTIALITY: You and/or your care partner have signed paperwork which will be entered into your electronic medical record.  These signatures attest to the fact that that the information above on your After Visit Summary has been reviewed and is understood.  Full responsibility of the confidentiality of this discharge information lies with you and/or your care-partner. 

## 2022-08-21 NOTE — Progress Notes (Signed)
Uneventful anesthetic. Report to pacu rn. Vss. Care resumed by rn.

## 2022-08-21 NOTE — Op Note (Signed)
[facility] Patient Name: Stephen York Procedure Date: 08/21/2022 2:22 PM MRN: [id number] Endoscopist: Clawson. Loletha Carrow , MD, [id number] Age: 72 Referring MD:  Date of Birth: 1951/05/02 Gender: Male Account #: [id number] Procedure:                Colonoscopy Indications:              Screening for colorectal malignant neoplasm                           No polyps last colonoscopy 2010 Medicines:                Monitored Anesthesia Care Procedure:                Pre-Anesthesia Assessment:                           - Prior to the procedure, a History and Physical                            was performed, and patient medications and                            allergies were reviewed. The patient's tolerance of                            previous anesthesia was also reviewed. The risks                            and benefits of the procedure and the sedation                            options and risks were discussed with the patient.                            All questions were answered, and informed consent                            was obtained. Prior Anticoagulants: The patient has                            taken no anticoagulant or antiplatelet agents. ASA                            Grade Assessment: III - A patient with severe                            systemic disease. After reviewing the risks and                            benefits, the patient was deemed in satisfactory                            condition to undergo the procedure.  After obtaining informed consent, the colonoscope                            was passed under direct vision. Throughout the                            procedure, the patient's blood pressure, pulse, and                            oxygen saturations were monitored continuously. The                            Olympus SN [id number] was introduced through the anus                            and advanced to the the  cecum, identified by                            appendiceal orifice and ileocecal valve. The                            colonoscopy was performed without difficulty. The                            patient tolerated the procedure well. The quality                            of the bowel preparation was good after lavage of                            opaque liquid. The ileocecal valve, appendiceal                            orifice, and rectum were photographed. Scope In: 2:29:57 PM Scope Out: 2:44:22 PM Scope Withdrawal Time: 0 hours 11 minutes 4 seconds  Total Procedure Duration: 0 hours 14 minutes 25 seconds  Findings:                 The perianal and digital rectal examinations were                            normal.                           Repeat examination of right colon under NBI                            performed.                           Multiple diverticula were found in the left colon.                           Internal hemorrhoids were found.  The exam was otherwise without abnormality on                            direct and retroflexion views. Complications:            No immediate complications. Estimated Blood Loss:     Estimated blood loss: none. Impression:               - Diverticulosis in the left colon.                           - Internal hemorrhoids.                           - The examination was otherwise normal on direct                            and retroflexion views.                           - No specimens collected. Recommendation:           - Patient has a contact number available for                            emergencies. The signs and symptoms of potential                            delayed complications were discussed with the                            patient. Return to normal activities tomorrow.                            Written discharge instructions were provided to the                            patient.                            - Resume previous diet.                           - Continue present medications.                           - No repeat screening colonoscopy recommended due                            to age, lack of polyps and current guidelines. Alleta Avery L. Loletha Carrow, MD 08/21/2022 2:48:54 PM This report has been signed electronically.

## 2022-08-22 ENCOUNTER — Telehealth: Payer: Self-pay | Admitting: *Deleted

## 2022-08-22 NOTE — Telephone Encounter (Signed)
  Follow up Call-     08/21/2022    1:09 PM  Call back number  Post procedure Call Back phone  # [phone]  Permission to leave phone message Yes     Patient questions:  Do you have a fever, pain , or abdominal swelling? No. Pain Score  0 *  Have you tolerated food without any problems? Yes.    Have you been able to return to your normal activities? Yes.    Do you have any questions about your discharge instructions: Diet   No. Medications  No. Follow up visit  No.  Do you have questions or concerns about your Care? No.  Actions: * If pain score is 4 or above: No action needed, pain <4.

## 2022-08-27 DIAGNOSIS — G4733 Obstructive sleep apnea (adult) (pediatric): Secondary | ICD-10-CM | POA: Diagnosis not present

## 2022-09-09 DIAGNOSIS — D649 Anemia, unspecified: Secondary | ICD-10-CM | POA: Diagnosis not present

## 2022-09-09 DIAGNOSIS — M549 Dorsalgia, unspecified: Secondary | ICD-10-CM | POA: Diagnosis not present

## 2022-09-25 ENCOUNTER — Other Ambulatory Visit: Payer: Self-pay | Admitting: Adult Health

## 2022-09-25 DIAGNOSIS — F988 Other specified behavioral and emotional disorders with onset usually occurring in childhood and adolescence: Secondary | ICD-10-CM

## 2022-09-25 DIAGNOSIS — G4733 Obstructive sleep apnea (adult) (pediatric): Secondary | ICD-10-CM | POA: Diagnosis not present

## 2022-09-25 NOTE — Telephone Encounter (Signed)
Requested Prescriptions   Pending Prescriptions Disp Refills   methylphenidate (RITALIN) 10 MG tablet [Pharmacy Med Name: methylphenidate 10 mg tablet] 30 tablet 0    Sig: TAKE ONE TABLET ONCE DAILY   Pt last seen by gna provider on 04/10/22 Clabe Seal NP) has upcoming appt on 10/24/22. Originating provider is out so I will route to the fill in provider to fill.  Prior Dispenses   Dispensed Days Supply Quantity Provider Pharmacy  methylphenidate 10 mg tablet 08/24/2022 30 30 each Ward Givens, NP [place]...  methylphenidate 10 mg tablet 07/25/2022 30 30 each Ward Givens, NP [place]...  methylphenidate 10 mg tablet 06/28/2022 30 30 each Ward Givens, NP [place]...  methylphenidate 10 mg tablet 05/28/2022 30 30 each Ward Givens, NP [place]...  methylphenidate 10 mg tablet 04/27/2022 30 30 each Ward Givens, NP [place]...  methylphenidate 10 mg tablet 03/28/2022 30 30 each Ward Givens, NP [place]...  methylphenidate 10 mg tablet 02/27/2022 30 30 each Ward Givens, NP [place]...  methylphenidate 10 mg tablet 01/23/2022 30 30 each Ward Givens, NP [place]...  methylphenidate 10 mg tablet 10/29/2021 90 90 each Ward Givens, NP [place].Marland KitchenMarland Kitchen

## 2022-10-23 ENCOUNTER — Other Ambulatory Visit: Payer: Self-pay | Admitting: Diagnostic Neuroimaging

## 2022-10-23 DIAGNOSIS — F988 Other specified behavioral and emotional disorders with onset usually occurring in childhood and adolescence: Secondary | ICD-10-CM

## 2022-10-24 ENCOUNTER — Ambulatory Visit: Payer: Medicare Other | Admitting: Adult Health

## 2022-10-24 ENCOUNTER — Encounter: Payer: Self-pay | Admitting: Adult Health

## 2022-10-24 VITALS — BP 110/70 | HR 69 | Ht 72.0 in | Wt 251.2 lb

## 2022-10-24 DIAGNOSIS — F900 Attention-deficit hyperactivity disorder, predominantly inattentive type: Secondary | ICD-10-CM | POA: Diagnosis not present

## 2022-10-24 DIAGNOSIS — G4733 Obstructive sleep apnea (adult) (pediatric): Secondary | ICD-10-CM | POA: Diagnosis not present

## 2022-10-24 NOTE — Progress Notes (Signed)
PATIENT: Stephen York DOB: Nov 28, 1950  REASON FOR VISIT: follow up HISTORY FROM: patient  Chief Complaint  Patient presents with   Follow-up    Rm 4, alone.  Cpap. No changed DL printed.       HISTORY OF PRESENT ILLNESS:  TODAY 10/24/22: Stephen York is a 72 y.o. male with a history of OSA on CPAP. Returns today for follow-up. CPAP works well. Ritalin continues to work well. Denies any new issues. DL shows good compliance- 30/30 days for 100%. Used his machine 8.6 hours on average. Residual AHI 1.7 on 5-15 cmh20. Reports that since he started CPAP he has been sleeping much better.    History:   04/10/22: Stephen York is a 72 year old male with a history of obstructive sleep apnea on CPAP and ADD.  He returns today for follow-up. No longer feeling tired in the afternoons. Can't sleep without CPAP. Continues to take ritalin 10 mg daily. Has better concentration.  CPAP download shows that he uses machine nightly for compliance of 100%.  He uses machine greater than 4 hours each night.  On average he uses his machine 8/2 hours.  His residual AHI is 1.9.  He denies any new symptoms.  Returns today for evaluation.  10/08/2021: Stephen York is a 72 year old male with a history of obstructive sleep apnea on CPAP.  He returns today for follow-up.  His CPAP download indicates that he uses machine nightly for compliance of 100%.  He uses machine greater than 4 hours 19 out of 30 days for compliance of 63%.  His residual AHI is 5.5 on 5-20 cmH2O.  His leak in the 95th percentile is 42.2 L/min.  He does state that he has a new mask that has been working better for him.  Although he does report that some nights he wakes up in the middle of the night and the pressure is too strong.  He has continued on Ritalin daily.  He states that he is able to focus better when he uses the medication.  He did not attempt to stop it after our last visit.  He returns today for an evaluation.  03/29/21:Mr.  York is a 72 year old male with a history of obstructive sleep apnea on CPAP.  The patient returns today for his initial CPAP visit.  His download indicates that he uses machine 29 out of 30 days for compliance of 97%.  He uses machine greater than 4 hours each night.  On average he uses his machine 8 hours.  His residual AHI is 2.9 on 5 to 20 cm of water.  He does not have a significant leak.  His pressure in the 95th percentile is 10.9 cm of water he reports that he noticed a significant difference in how he feels.  No longer waking up with headaches.  He has been taking Ritalin daily for ADD.  However he is considering doing a trial off the medication now that he is on CPAP.   REVIEW OF SYSTEMS: Out of a complete 14 system review of symptoms, the patient complains only of the following symptoms, and all other reviewed systems are negative.  FSS 17 ESS 2  ALLERGIES: No Known Allergies   HOME MEDICATIONS: Outpatient Medications Prior to Visit  Medication Sig Dispense Refill   acetaminophen (TYLENOL) 500 MG tablet Take 500 mg by mouth every 6 (six) hours as needed. Per pt will take one tablet daily in evening     allopurinol (ZYLOPRIM) 300 MG tablet  Take 300 mg by mouth daily.     amLODipine (NORVASC) 10 MG tablet Take 10 mg by mouth daily.     Ascorbic Acid (VITAMIN C PO) Take 1 tablet by mouth daily at 6 (six) AM.     atorvastatin (LIPITOR) 40 MG tablet Take 40 mg by mouth daily.     BIOTIN PO Take 5,000 mg by mouth daily at 6 (six) AM.     Carboxymethylcellulose Sodium (DRY EYE RELIEF OP) Apply to eye as needed.     carvedilol (COREG) 6.25 MG tablet Take 6.25 mg by mouth daily.     Cholecalciferol (VITAMIN D3) 75 MCG (3000 UT) TABS Take 1 tablet by mouth daily.     Coenzyme Q10 (COQ-10) 100 MG CAPS Take 1 capsule by mouth at bedtime.     diphenhydrAMINE (BENADRYL) 25 mg capsule Take 25 mg by mouth at bedtime as needed for sleep.     diphenhydramine-acetaminophen (TYLENOL PM) 25-500  MG TABS tablet Take 1 tablet by mouth at bedtime as needed.     doxycycline (MONODOX) 100 MG capsule TAKE ONE CAPSULE BY MOUTH DAILY EVER MORNING WITH FOOD FOR ROSACEA     esomeprazole (NEXIUM) 20 MG capsule Take 20 mg by mouth daily at 12 noon.     ferrous sulfate 325 (65 FE) MG tablet Take 325 mg by mouth daily with breakfast.     HYDROcodone-acetaminophen (NORCO/VICODIN) 5-325 MG tablet Take 1 tablet by mouth every 12 (twelve) hours as needed.     lisinopril (ZESTRIL) 40 MG tablet Take 40 mg by mouth daily.     MAGNESIUM PO Take 20 mg by mouth daily at 6 (six) AM.     Melatonin 10 MG CAPS Take 1 capsule by mouth at bedtime.     Menthol, Topical Analgesic, (BLUE GEL EX) Apply topically as needed.     methocarbamol (ROBAXIN) 500 MG tablet Take 500 mg by mouth every 12 (twelve) hours.     methylphenidate (RITALIN) 10 MG tablet TAKE ONE TABLET ONCE DAILY 30 tablet 0   metroNIDAZOLE (METROGEL) 1 % gel Apply 1 Application topically daily.     mupirocin ointment (BACTROBAN) 2 % Apply 1 Application topically 2 (two) times daily.     naloxone (NARCAN) nasal spray 4 mg/0.1 mL Place 1 spray into the nose as needed.     Omega-3 Fatty Acids (FISH OIL) 1000 MG CAPS Take 2 capsules by mouth 2 (two) times daily.     Potassium 99 MG TABS Take 1 tablet by mouth daily.     sertraline (ZOLOFT) 100 MG tablet Take 100 mg by mouth at bedtime.     vitamin E 1000 UNIT capsule Take 400 Units by mouth daily.      No facility-administered medications prior to visit.    PAST MEDICAL HISTORY: Past Medical History:  Diagnosis Date   ADD (attention deficit hyperactivity disorder, inattentive type)    Anemia    on iron at this time (07/26/2022)   Chronic gout    04-20-2021  pt stated effect bilateral great toe's,  last episode several months ago   Chronic low back pain    Chronic sinusitis    Degenerative disc disease, lumbar    lower back, nack, bilateral kness   GERD (gastroesophageal reflux disease)     Hiatal hernia    History of kidney stones 03/25/2011   passed it   History of substance use    pt stated stopped 1981 at age 56   Hyperlipidemia  on meds   Hypertension    followed by pcp   (04-20-2021  pt stated he had a stress test approx. 2007, told ok)-on meds   OA (osteoarthritis)    hands, feets, shoulders, knees   OSA on CPAP    followed by dr dohmeier;   sleep study in epic 01-29-2021 severe osa uses cpap nightly   Personal history of alcoholism (HCC)    pt stated stopped 60 at age 24   Pre-diabetes    PTSD (post-traumatic stress disorder)    Seasonal allergies    Trigger finger, left    ring    PAST SURGICAL HISTORY: Past Surgical History:  Procedure Laterality Date   FOOT SURGERY Bilateral 2002   approx.  ;     great toe both feet for arthritis   KNEE ARTHROSCOPY Left 2016   LUMBAR FUSION  1995   L4-5   LUMBAR MICRODISCECTOMY     1988  L4-5;  redo 1991 L4-5   SHOULDER ARTHROSCOPY Left 2008   approx   TRIGGER FINGER RELEASE Right 06/2020   ring finger   TRIGGER FINGER RELEASE Left 04/26/2021   Procedure: RELEASE TRIGGER FINGER/A-1 PULLEY;  Surgeon: Gomez Cleverly, MD;  Location: [facility];  Service: Orthopedics;  Laterality: Left;  with LOCAL   WISDOM TOOTH EXTRACTION      FAMILY HISTORY: Family History  Problem Relation Age of Onset   Aneurysm Mother    Alcohol abuse Father    Sleep apnea Sister    Colon polyps Neg Hx    Colon cancer Neg Hx    Esophageal cancer Neg Hx    Rectal cancer Neg Hx    Stomach cancer Neg Hx     SOCIAL HISTORY: Social History   Socioeconomic History   Marital status: Married    Spouse name: Furniture conservator/restorer   Number of children: 2   Years of education: 12   Highest education level: Not on file  Occupational History   Occupation: retired  Tobacco Use   Smoking status: Former    Packs/day: 2.00    Years: 30.00    Additional pack years: 0.00    Total pack years: 60.00    Types: Cigarettes    Quit date:  09/29/1992    Years since quitting: 30.0   Smokeless tobacco: Never  Vaping Use   Vaping Use: Never used  Substance and Sexual Activity   Alcohol use: Not Currently    Comment: history alcoholism in remission quit in 37 (age 78)   Drug use: Not Currently    Types: Marijuana, Mescaline, Other-see comments    Comment: pt stated stopped  pt stopped 30 (age 72)   Sexual activity: Not on file  Other Topics Concern   Not on file  Social History Narrative   Caffeine 3 cups in am,    Married, 2 kids. Retired.     Right handed   Social Determinants of Health   Financial Resource Strain: Not on file  Food Insecurity: Not on file  Transportation Needs: Not on file  Physical Activity: Not on file  Stress: Not on file  Social Connections: Not on file  Intimate Partner Violence: Not on file      PHYSICAL EXAM  Vitals:   10/24/22 1259  BP: 110/70  Pulse: 69  Weight: 251 lb 3.2 oz (113.9 kg)  Height: 6' (1.829 m)   Body mass index is 34.07 kg/m.  Generalized: Well developed, in no acute distress  Chest: Lungs  clear to auscultation bilaterally  Neurological examination  Mentation: Alert oriented to time, place, history taking. Follows all commands speech and language fluent Cranial nerve II-XII: Extraocular movements were full, visual field were full on confrontational test Head turning and shoulder shrug  were normal and symmetric. Gait and station: Gait is normal.    DIAGNOSTIC DATA (LABS, IMAGING, TESTING) - I reviewed patient records, labs, notes, testing and imaging myself where available.  Lab Results  Component Value Date   HGB 14.3 04/26/2021   HCT 42.0 04/26/2021      Component Value Date/Time   NA 138 04/26/2021 0707   K 3.9 04/26/2021 0707   CL 104 04/26/2021 0707   CO2 24 08/31/2018 1017   GLUCOSE 115 (H) 04/26/2021 0707   BUN 29 (H) 04/26/2021 0707   CREATININE 0.90 04/26/2021 0707   CALCIUM 9.6 08/31/2018 1017   GFRNONAA >60 08/31/2018 1017    GFRAA >60 08/31/2018 1017      ASSESSMENT AND PLAN 72 y.o. year old male  has a past medical history of ADD (attention deficit hyperactivity disorder, inattentive type), Anemia, Chronic gout, Chronic low back pain, Chronic sinusitis, Degenerative disc disease, lumbar, GERD (gastroesophageal reflux disease), Hiatal hernia, History of kidney stones (03/25/2011), History of substance use, Hyperlipidemia, Hypertension, OA (osteoarthritis), OSA on CPAP, Personal history of alcoholism (HCC), Pre-diabetes, PTSD (post-traumatic stress disorder), Seasonal allergies, and Trigger finger, left. here with:  OSA on CPAP ADHD  - CPAP compliance excellent - Good treatment of AHI  - Encourage patient to use CPAP nightly and > 4 hours each night - Continue Ritalin 10 mg daily - F/U in 6 months or sooner if needed    Butch Penny, MSN, NP-C 10/24/2022, 1:13 PM [facility] [address] [place], [place] [address] [phone]

## 2022-10-24 NOTE — Patient Instructions (Signed)
Continue using CPAP nightly and greater than 4 hours each night Continue Ritalin  If your symptoms worsen or you develop new symptoms please let us know.

## 2022-11-11 DIAGNOSIS — I10 Essential (primary) hypertension: Secondary | ICD-10-CM | POA: Diagnosis not present

## 2022-11-11 DIAGNOSIS — M47816 Spondylosis without myelopathy or radiculopathy, lumbar region: Secondary | ICD-10-CM | POA: Diagnosis not present

## 2022-11-11 DIAGNOSIS — E782 Mixed hyperlipidemia: Secondary | ICD-10-CM | POA: Diagnosis not present

## 2022-11-11 DIAGNOSIS — R7303 Prediabetes: Secondary | ICD-10-CM | POA: Diagnosis not present

## 2022-11-11 DIAGNOSIS — M109 Gout, unspecified: Secondary | ICD-10-CM | POA: Diagnosis not present

## 2022-11-11 DIAGNOSIS — Z87891 Personal history of nicotine dependence: Secondary | ICD-10-CM | POA: Diagnosis not present

## 2022-11-11 DIAGNOSIS — M549 Dorsalgia, unspecified: Secondary | ICD-10-CM | POA: Diagnosis not present

## 2022-11-20 ENCOUNTER — Other Ambulatory Visit: Payer: Self-pay | Admitting: Adult Health

## 2022-11-20 DIAGNOSIS — F988 Other specified behavioral and emotional disorders with onset usually occurring in childhood and adolescence: Secondary | ICD-10-CM

## 2022-11-20 NOTE — Telephone Encounter (Signed)
Last visit 10/24/22 Next visit 05/08/23  Per [place] registry:    Rx refill sent to [facility]

## 2022-11-26 DIAGNOSIS — G4733 Obstructive sleep apnea (adult) (pediatric): Secondary | ICD-10-CM | POA: Diagnosis not present

## 2022-12-21 ENCOUNTER — Other Ambulatory Visit: Payer: Self-pay | Admitting: Adult Health

## 2022-12-21 DIAGNOSIS — F988 Other specified behavioral and emotional disorders with onset usually occurring in childhood and adolescence: Secondary | ICD-10-CM

## 2022-12-24 ENCOUNTER — Telehealth: Payer: Self-pay | Admitting: Adult Health

## 2022-12-24 NOTE — Telephone Encounter (Signed)
[place] drug registry checked, last filled 11/23/22 30tab. Pt last seen 10/24/22, has upcoming appt 05/08/23. Refill is appropriate.

## 2022-12-24 NOTE — Telephone Encounter (Signed)
Pt wants to know when refill request for methylphenidate (RITALIN) 10 MG tablet will be sent to pharmacy. Pt said request was sent on 6/29.

## 2022-12-24 NOTE — Telephone Encounter (Signed)
Addressed in Rx rq.

## 2023-01-07 DIAGNOSIS — G4733 Obstructive sleep apnea (adult) (pediatric): Secondary | ICD-10-CM | POA: Diagnosis not present

## 2023-01-21 ENCOUNTER — Other Ambulatory Visit: Payer: Self-pay | Admitting: Adult Health

## 2023-01-21 DIAGNOSIS — F988 Other specified behavioral and emotional disorders with onset usually occurring in childhood and adolescence: Secondary | ICD-10-CM

## 2023-01-27 ENCOUNTER — Telehealth: Payer: Self-pay | Admitting: Adult Health

## 2023-01-27 DIAGNOSIS — F988 Other specified behavioral and emotional disorders with onset usually occurring in childhood and adolescence: Secondary | ICD-10-CM

## 2023-01-27 MED ORDER — METHYLPHENIDATE HCL 10 MG PO TABS
10.0000 mg | ORAL_TABLET | Freq: Every day | ORAL | 0 refills | Status: DC
Start: 2023-01-27 — End: 2023-02-26

## 2023-01-27 NOTE — Telephone Encounter (Signed)
Pt's wife, Abbott Pao, the pharamacy does not have methylphenidate (RITALIN) 10 MG tablet in stock for refill. Could you send the prescription to refill; to  [facility]  [address] [place], [place] [address]  Phone:  [phone]

## 2023-02-11 DIAGNOSIS — M549 Dorsalgia, unspecified: Secondary | ICD-10-CM | POA: Diagnosis not present

## 2023-02-11 DIAGNOSIS — I10 Essential (primary) hypertension: Secondary | ICD-10-CM | POA: Diagnosis not present

## 2023-02-11 DIAGNOSIS — E782 Mixed hyperlipidemia: Secondary | ICD-10-CM | POA: Diagnosis not present

## 2023-02-11 DIAGNOSIS — M47816 Spondylosis without myelopathy or radiculopathy, lumbar region: Secondary | ICD-10-CM | POA: Diagnosis not present

## 2023-02-11 DIAGNOSIS — R7303 Prediabetes: Secondary | ICD-10-CM | POA: Diagnosis not present

## 2023-02-25 DIAGNOSIS — G4733 Obstructive sleep apnea (adult) (pediatric): Secondary | ICD-10-CM | POA: Diagnosis not present

## 2023-02-26 ENCOUNTER — Other Ambulatory Visit: Payer: Self-pay | Admitting: Adult Health

## 2023-02-26 DIAGNOSIS — G4733 Obstructive sleep apnea (adult) (pediatric): Secondary | ICD-10-CM | POA: Diagnosis not present

## 2023-02-26 DIAGNOSIS — F988 Other specified behavioral and emotional disorders with onset usually occurring in childhood and adolescence: Secondary | ICD-10-CM

## 2023-02-26 MED ORDER — METHYLPHENIDATE HCL 10 MG PO TABS
10.0000 mg | ORAL_TABLET | Freq: Every day | ORAL | 0 refills | Status: DC
Start: 2023-02-26 — End: 2023-02-27

## 2023-02-26 NOTE — Telephone Encounter (Signed)
Pt is requesting a refill for methylphenidate (RITALIN) 10 MG tablet  .  [facility] [phone]

## 2023-02-27 ENCOUNTER — Other Ambulatory Visit: Payer: Self-pay | Admitting: *Deleted

## 2023-02-27 DIAGNOSIS — F988 Other specified behavioral and emotional disorders with onset usually occurring in childhood and adolescence: Secondary | ICD-10-CM

## 2023-02-27 MED ORDER — METHYLPHENIDATE HCL 10 MG PO TABS
10.0000 mg | ORAL_TABLET | Freq: Every day | ORAL | 0 refills | Status: AC
Start: 2023-02-27 — End: ?

## 2023-03-26 ENCOUNTER — Other Ambulatory Visit: Payer: Self-pay | Admitting: Adult Health

## 2023-03-26 DIAGNOSIS — F988 Other specified behavioral and emotional disorders with onset usually occurring in childhood and adolescence: Secondary | ICD-10-CM

## 2023-03-26 MED ORDER — METHYLPHENIDATE HCL 10 MG PO TABS
10.0000 mg | ORAL_TABLET | Freq: Every day | ORAL | 0 refills | Status: DC
Start: 2023-03-26 — End: 2023-04-28

## 2023-03-26 NOTE — Telephone Encounter (Signed)
Pt is requesting a refill for methylphenidate (RITALIN) 10 MG tablet.  Pharmacy: [facility]

## 2023-03-26 NOTE — Telephone Encounter (Signed)
Last seen 10/24/22  Next visit 05/08/23  Rx last filled 02/27/23 #30/30 Rx sent to [facility] NP to approve

## 2023-04-10 DIAGNOSIS — G4733 Obstructive sleep apnea (adult) (pediatric): Secondary | ICD-10-CM | POA: Diagnosis not present

## 2023-04-23 ENCOUNTER — Other Ambulatory Visit: Payer: Self-pay | Admitting: Adult Health

## 2023-04-23 DIAGNOSIS — F988 Other specified behavioral and emotional disorders with onset usually occurring in childhood and adolescence: Secondary | ICD-10-CM

## 2023-04-24 NOTE — Telephone Encounter (Signed)
Last visit 10/24/22 Next visit 05/08/23  Per Epic adherence report, last filled:

## 2023-05-01 ENCOUNTER — Telehealth: Payer: Self-pay | Admitting: Adult Health

## 2023-05-01 NOTE — Telephone Encounter (Signed)
Patient called stating he would like to reschedule his appt with Aundra Millet due to he has to go to the [place] to check his shoulder out.

## 2023-05-01 NOTE — Telephone Encounter (Signed)
Spoke with patient. He has been rescheduled to 08/07/22 at 1100 am. He was appreciative.

## 2023-05-08 ENCOUNTER — Ambulatory Visit: Payer: Medicare Other | Admitting: Adult Health

## 2023-05-12 DIAGNOSIS — M549 Dorsalgia, unspecified: Secondary | ICD-10-CM | POA: Diagnosis not present

## 2023-05-12 DIAGNOSIS — G894 Chronic pain syndrome: Secondary | ICD-10-CM | POA: Diagnosis not present

## 2023-05-12 DIAGNOSIS — Z9989 Dependence on other enabling machines and devices: Secondary | ICD-10-CM | POA: Diagnosis not present

## 2023-05-19 ENCOUNTER — Telehealth: Payer: Self-pay | Admitting: Adult Health

## 2023-05-19 NOTE — Telephone Encounter (Signed)
Returned pt's call regarding his early refill request for Ritalin 10 mg. Pt stated that he had only 4 pills left on his medication and he needed a refill, however while we were on the phone he started looking for something and he found them, he had forgotten he had placed them on a different pill organizer. Pt stated he should be fine with enough medication to last him until Dec/09/2022. Pt thanked me for calling.

## 2023-05-19 NOTE — Telephone Encounter (Signed)
Pt is requesting a refill for methylphenidate (RITALIN) 10 MG tablet.  Pharmacy: [facility] Pt is asking this be done so he is able to pick this up on Saturday

## 2023-05-27 ENCOUNTER — Other Ambulatory Visit: Payer: Self-pay | Admitting: Adult Health

## 2023-05-27 DIAGNOSIS — F988 Other specified behavioral and emotional disorders with onset usually occurring in childhood and adolescence: Secondary | ICD-10-CM

## 2023-06-01 DIAGNOSIS — G4733 Obstructive sleep apnea (adult) (pediatric): Secondary | ICD-10-CM | POA: Diagnosis not present

## 2023-06-23 ENCOUNTER — Other Ambulatory Visit: Payer: Self-pay | Admitting: Adult Health

## 2023-06-23 DIAGNOSIS — F988 Other specified behavioral and emotional disorders with onset usually occurring in childhood and adolescence: Secondary | ICD-10-CM

## 2023-07-12 DIAGNOSIS — G4733 Obstructive sleep apnea (adult) (pediatric): Secondary | ICD-10-CM | POA: Diagnosis not present

## 2023-07-26 ENCOUNTER — Other Ambulatory Visit: Payer: Self-pay | Admitting: Neurology

## 2023-07-26 DIAGNOSIS — F988 Other specified behavioral and emotional disorders with onset usually occurring in childhood and adolescence: Secondary | ICD-10-CM

## 2023-07-28 ENCOUNTER — Telehealth: Payer: Self-pay

## 2023-07-28 NOTE — Telephone Encounter (Signed)
Patient called in and advised that he is needing a refill of his  methylphenidate (RITALIN) 10 MG tablet sent to [facility] [place], [place] - [address]

## 2023-07-28 NOTE — Telephone Encounter (Signed)
Last seen on 10/24/22 Follow up scheduled 08/08/23 Last filled on 06/26/23 #30 tablets (30 day supply) Rx pending to be signed

## 2023-07-28 NOTE — Telephone Encounter (Signed)
 Addressed in refill encounter

## 2023-08-08 ENCOUNTER — Ambulatory Visit: Payer: Medicare Other | Admitting: Adult Health

## 2023-08-08 VITALS — BP 132/85 | HR 80 | Ht 72.0 in | Wt 246.0 lb

## 2023-08-08 DIAGNOSIS — G4733 Obstructive sleep apnea (adult) (pediatric): Secondary | ICD-10-CM | POA: Diagnosis not present

## 2023-08-08 DIAGNOSIS — F900 Attention-deficit hyperactivity disorder, predominantly inattentive type: Secondary | ICD-10-CM | POA: Diagnosis not present

## 2023-08-08 NOTE — Patient Instructions (Signed)
Continue using CPAP nightly and greater than 4 hours each night Continue Ritalin  If your symptoms worsen or you develop new symptoms please let us know.

## 2023-08-08 NOTE — Progress Notes (Signed)
PATIENT: Stephen York DOB: February 08, 1951  REASON FOR VISIT: follow up HISTORY FROM: patient  Chief Complaint  Patient presents with   Follow-up    Pt alone, rm 1. Here for OSA follow up. [employer]. [employer]. Overall doing well.      HISTORY OF PRESENT ILLNESS:  TODAY 08/08/23:   Stephen York is a 73 y.o. male with a history of OSA on CPAP and ADHD. Returns today for follow-up. Reports that cpap is working well. Continues to find it beneficial. Remains on ritalin and that is working well. BP and heart reate in normal range. DL is below.        History:   Stephen York is a 73 y.o. male with a history of OSA on CPAP. Returns today for follow-up. CPAP works well. Ritalin continues to work well. Denies any new issues. DL shows good compliance- 30/30 days for 100%. Used his machine 8.6 hours on average. Residual AHI 1.7 on 5-15 cmh20. Reports that since he started CPAP he has been sleeping much better.   04/10/22: Stephen York is a 73 year old male with a history of obstructive sleep apnea on CPAP and ADD.  He returns today for follow-up. No longer feeling tired in the afternoons. Can't sleep without CPAP. Continues to take ritalin 10 mg daily. Has better concentration.  CPAP download shows that he uses machine nightly for compliance of 100%.  He uses machine greater than 4 hours each night.  On average he uses his machine 8/2 hours.  His residual AHI is 1.9.  He denies any new symptoms.  Returns today for evaluation.  10/08/2021: Stephen York is a 73 year old male with a history of obstructive sleep apnea on CPAP.  He returns today for follow-up.  His CPAP download indicates that he uses machine nightly for compliance of 100%.  He uses machine greater than 4 hours 19 out of 30 days for compliance of 63%.  His residual AHI is 5.5 on 5-20 cmH2O.  His leak in the 95th percentile is 42.2 L/min.  He does state that he has a new mask that has been working better for him.   Although he does report that some nights he wakes up in the middle of the night and the pressure is too strong.  He has continued on Ritalin daily.  He states that he is able to focus better when he uses the medication.  He did not attempt to stop it after our last visit.  He returns today for an evaluation.  03/29/21:Stephen York is a 73 year old male with a history of obstructive sleep apnea on CPAP.  The patient returns today for his initial CPAP visit.  His download indicates that he uses machine 29 out of 30 days for compliance of 97%.  He uses machine greater than 4 hours each night.  On average he uses his machine 8 hours.  His residual AHI is 2.9 on 5 to 20 cm of water.  He does not have a significant leak.  His pressure in the 95th percentile is 10.9 cm of water he reports that he noticed a significant difference in how he feels.  No longer waking up with headaches.  He has been taking Ritalin daily for ADD.  However he is considering doing a trial off the medication now that he is on CPAP.   REVIEW OF SYSTEMS: Out of a complete 14 system review of symptoms, the patient complains only of the following symptoms, and all other  reviewed systems are negative.   ESS 5  ALLERGIES: No Known Allergies   HOME MEDICATIONS: Outpatient Medications Prior to Visit  Medication Sig Dispense Refill   acetaminophen (TYLENOL) 500 MG tablet Take 500 mg by mouth every 6 (six) hours as needed. Per pt will take one tablet daily in evening     allopurinol (ZYLOPRIM) 300 MG tablet Take 300 mg by mouth daily.     amLODipine (NORVASC) 10 MG tablet Take 10 mg by mouth daily.     Ascorbic Acid (VITAMIN C PO) Take 1 tablet by mouth daily at 6 (six) AM.     atorvastatin (LIPITOR) 40 MG tablet Take 40 mg by mouth daily.     BIOTIN PO Take 5,000 mg by mouth daily at 6 (six) AM.     Carboxymethylcellulose Sodium (DRY EYE RELIEF OP) Apply to eye as needed.     carvedilol (COREG) 6.25 MG tablet Take 6.25 mg by mouth  daily.     Cholecalciferol (VITAMIN D3) 75 MCG (3000 UT) TABS Take 1 tablet by mouth daily.     Coenzyme Q10 (COQ-10) 100 MG CAPS Take 1 capsule by mouth at bedtime.     diphenhydrAMINE (BENADRYL) 25 mg capsule Take 25 mg by mouth at bedtime as needed for sleep.     diphenhydramine-acetaminophen (TYLENOL PM) 25-500 MG TABS tablet Take 1 tablet by mouth at bedtime as needed.     doxycycline (MONODOX) 100 MG capsule TAKE ONE CAPSULE BY MOUTH DAILY EVER MORNING WITH FOOD FOR ROSACEA     esomeprazole (NEXIUM) 20 MG capsule Take 20 mg by mouth daily at 12 noon.     ferrous sulfate 325 (65 FE) MG tablet Take 325 mg by mouth daily with breakfast.     HYDROcodone-acetaminophen (NORCO/VICODIN) 5-325 MG tablet Take 1 tablet by mouth every 12 (twelve) hours as needed.     lisinopril (ZESTRIL) 40 MG tablet Take 40 mg by mouth daily.     MAGNESIUM PO Take 20 mg by mouth daily at 6 (six) AM.     Melatonin 10 MG CAPS Take 1 capsule by mouth at bedtime.     Menthol, Topical Analgesic, (BLUE GEL EX) Apply topically as needed.     methocarbamol (ROBAXIN) 500 MG tablet Take 500 mg by mouth every 12 (twelve) hours.     methylphenidate (RITALIN) 10 MG tablet TAKE ONE TABLET DAILY 30 tablet 0   metroNIDAZOLE (METROGEL) 1 % gel Apply 1 Application topically daily.     mupirocin ointment (BACTROBAN) 2 % Apply 1 Application topically 2 (two) times daily.     naloxone (NARCAN) nasal spray 4 mg/0.1 mL Place 1 spray into the nose as needed.     Omega-3 Fatty Acids (FISH OIL) 1000 MG CAPS Take 2 capsules by mouth 2 (two) times daily.     Potassium 99 MG TABS Take 1 tablet by mouth daily.     sertraline (ZOLOFT) 100 MG tablet Take 100 mg by mouth at bedtime.     vitamin E 1000 UNIT capsule Take 400 Units by mouth daily.      No facility-administered medications prior to visit.    PAST MEDICAL HISTORY: Past Medical History:  Diagnosis Date   ADD (attention deficit hyperactivity disorder, inattentive type)    Anemia     on iron at this time (07/26/2022)   Chronic gout    04-20-2021  pt stated effect bilateral great toe's,  last episode several months ago   Chronic low back pain  Chronic sinusitis    Degenerative disc disease, lumbar    lower back, nack, bilateral kness   GERD (gastroesophageal reflux disease)    Hiatal hernia    History of kidney stones 03/25/2011   passed it   History of substance use    pt stated stopped 1981 at age 70   Hyperlipidemia    on meds   Hypertension    followed by pcp   (04-20-2021  pt stated he had a stress test approx. 2007, told ok)-on meds   OA (osteoarthritis)    hands, feets, shoulders, knees   OSA on CPAP    followed by dr dohmeier;   sleep study in epic 01-29-2021 severe osa uses cpap nightly   Personal history of alcoholism (HCC)    pt stated stopped 51 at age 44   Pre-diabetes    PTSD (post-traumatic stress disorder)    Seasonal allergies    Trigger finger, left    ring    PAST SURGICAL HISTORY: Past Surgical History:  Procedure Laterality Date   FOOT SURGERY Bilateral 2002   approx.  ;     great toe both feet for arthritis   KNEE ARTHROSCOPY Left 2016   LUMBAR FUSION  1995   L4-5   LUMBAR MICRODISCECTOMY     1988  L4-5;  redo 1991 L4-5   SHOULDER ARTHROSCOPY Left 2008   approx   TRIGGER FINGER RELEASE Right 06/2020   ring finger   TRIGGER FINGER RELEASE Left 04/26/2021   Procedure: RELEASE TRIGGER FINGER/A-1 PULLEY;  Surgeon: Gomez Cleverly, MD;  Location: [facility] [place];  Service: Orthopedics;  Laterality: Left;  with LOCAL   WISDOM TOOTH EXTRACTION      FAMILY HISTORY: Family History  Problem Relation Age of Onset   Aneurysm Mother    Alcohol abuse Father    Sleep apnea Sister    Colon polyps Neg Hx    Colon cancer Neg Hx    Esophageal cancer Neg Hx    Rectal cancer Neg Hx    Stomach cancer Neg Hx     SOCIAL HISTORY: Social History   Socioeconomic History   Marital status: Married    Spouse name: Furniture conservator/restorer    Number of children: 2   Years of education: 12   Highest education level: Not on file  Occupational History   Occupation: retired  Tobacco Use   Smoking status: Former    Current packs/day: 0.00    Average packs/day: 2.0 packs/day for 30.0 years (60.0 ttl pk-yrs)    Types: Cigarettes    Start date: 09/30/1962    Quit date: 09/29/1992    Years since quitting: 30.8   Smokeless tobacco: Never  Vaping Use   Vaping status: Never Used  Substance and Sexual Activity   Alcohol use: Not Currently    Comment: history alcoholism in remission quit in 76 (age 36)   Drug use: Not Currently    Types: Marijuana, Mescaline, Other-see comments    Comment: pt stated stopped  pt stopped 72 (age 37)   Sexual activity: Not on file  Other Topics Concern   Not on file  Social History Narrative   Caffeine 3 cups in am,    Married, 2 kids. Retired.     Right handed   Social Drivers of Health   Financial Resource Strain: Not on file  Food Insecurity: Not on file  Transportation Needs: Not on file  Physical Activity: Not on file  Stress: Not on file  Social Connections: Not on file  Intimate Partner Violence: Not on file      PHYSICAL EXAM  Vitals:   08/08/23 1018  BP: 132/85  Pulse: 80  Weight: 246 lb (111.6 kg)  Height: 6' (1.829 m)   Body mass index is 33.36 kg/m.  Generalized: Well developed, in no acute distress  Chest: Lungs clear to auscultation bilaterally  Neurological examination  Mentation: Alert oriented to time, place, history taking. Follows all commands speech and language fluent Cranial nerve II-XII: Extraocular movements were full, visual field were full on confrontational test Head turning and shoulder shrug  were normal and symmetric. Gait and station: Gait is normal.    DIAGNOSTIC DATA (LABS, IMAGING, TESTING) - I reviewed patient records, labs, notes, testing and imaging myself where available.  Lab Results  Component Value Date   HGB 14.3 04/26/2021    HCT 42.0 04/26/2021      Component Value Date/Time   NA 138 04/26/2021 0707   K 3.9 04/26/2021 0707   CL 104 04/26/2021 0707   CO2 24 08/31/2018 1017   GLUCOSE 115 (H) 04/26/2021 0707   BUN 29 (H) 04/26/2021 0707   CREATININE 0.90 04/26/2021 0707   CALCIUM 9.6 08/31/2018 1017   GFRNONAA >60 08/31/2018 1017   GFRAA >60 08/31/2018 1017      ASSESSMENT AND PLAN 73 y.o. year old male  has a past medical history of ADD (attention deficit hyperactivity disorder, inattentive type), Anemia, Chronic gout, Chronic low back pain, Chronic sinusitis, Degenerative disc disease, lumbar, GERD (gastroesophageal reflux disease), Hiatal hernia, History of kidney stones (03/25/2011), History of substance use, Hyperlipidemia, Hypertension, OA (osteoarthritis), OSA on CPAP, Personal history of alcoholism (HCC), Pre-diabetes, PTSD (post-traumatic stress disorder), Seasonal allergies, and Trigger finger, left. here with:  OSA on CPAP ADHD  - CPAP compliance excellent - Good treatment of AHI  - Encourage patient to use CPAP nightly and > 4 hours each night - Continue Ritalin 10 mg daily - F/U in 6 months or sooner if needed    Butch Penny, MSN, NP-C 08/08/2023, 10:48 AM [facility] [address] [place], [place] [address] [phone]

## 2023-08-23 ENCOUNTER — Other Ambulatory Visit: Payer: Self-pay | Admitting: Neurology

## 2023-08-23 DIAGNOSIS — F988 Other specified behavioral and emotional disorders with onset usually occurring in childhood and adolescence: Secondary | ICD-10-CM

## 2023-08-25 NOTE — Telephone Encounter (Signed)
 Dr.Yan you are work in provider  Last seen on 08/08/23 Follow up scheduled on 03/11/24 Last filled on 07/28/23 #30 tablets  Rx pending to be signed

## 2023-08-27 DIAGNOSIS — Z23 Encounter for immunization: Secondary | ICD-10-CM | POA: Diagnosis not present

## 2023-08-27 DIAGNOSIS — G894 Chronic pain syndrome: Secondary | ICD-10-CM | POA: Diagnosis not present

## 2023-08-27 DIAGNOSIS — I10 Essential (primary) hypertension: Secondary | ICD-10-CM | POA: Diagnosis not present

## 2023-08-27 DIAGNOSIS — E782 Mixed hyperlipidemia: Secondary | ICD-10-CM | POA: Diagnosis not present

## 2023-08-27 DIAGNOSIS — R7303 Prediabetes: Secondary | ICD-10-CM | POA: Diagnosis not present

## 2023-09-04 DIAGNOSIS — Z0000 Encounter for general adult medical examination without abnormal findings: Secondary | ICD-10-CM | POA: Diagnosis not present

## 2023-09-06 DIAGNOSIS — G4733 Obstructive sleep apnea (adult) (pediatric): Secondary | ICD-10-CM | POA: Diagnosis not present

## 2023-09-08 ENCOUNTER — Other Ambulatory Visit: Payer: Self-pay | Admitting: Adult Health

## 2023-09-08 DIAGNOSIS — F988 Other specified behavioral and emotional disorders with onset usually occurring in childhood and adolescence: Secondary | ICD-10-CM

## 2023-09-08 MED ORDER — METHYLPHENIDATE HCL 10 MG PO TABS
10.0000 mg | ORAL_TABLET | Freq: Every day | ORAL | 0 refills | Status: DC
Start: 2023-09-08 — End: 2023-09-29

## 2023-09-08 NOTE — Addendum Note (Signed)
 Addended by: Guy Begin on: 09/08/2023 03:30 PM   Modules accepted: Orders

## 2023-09-08 NOTE — Telephone Encounter (Signed)
 Pt stopped in this AM around 11:49am and said he picked up his meds 08/27/23, but believes he accidentally threw them away this past Thursday. He says its the Ritalin pills and would like 16 pills called into [facility] in [place], [place] just to get him through month. Please call pt with any questions and just to let him know when they will be called in.

## 2023-09-08 NOTE — Telephone Encounter (Addendum)
 Pt picked from pharmacy 08-27-2023 #30.  Thru away bottle 09/07/2023.  States needs another 16 tablets as accidentally thru away the bottle.

## 2023-09-09 NOTE — Telephone Encounter (Signed)
 I called pt, spoke to wife and pt had found the medication.  He called pharmacy and said to not fill.  Appreciated follow up and was thankful that we would do that.  I called pharmacy and cancelled the prescription.

## 2023-09-29 ENCOUNTER — Other Ambulatory Visit: Payer: Self-pay | Admitting: Adult Health

## 2023-09-29 DIAGNOSIS — F988 Other specified behavioral and emotional disorders with onset usually occurring in childhood and adolescence: Secondary | ICD-10-CM

## 2023-09-29 MED ORDER — METHYLPHENIDATE HCL 10 MG PO TABS
10.0000 mg | ORAL_TABLET | Freq: Every day | ORAL | 0 refills | Status: DC
Start: 2023-09-29 — End: 2023-10-30

## 2023-09-29 NOTE — Telephone Encounter (Addendum)
  Next visit 03/11/24  Per epic, last refills were:   Rx request sent to [facility] NP to e-scribe.

## 2023-09-29 NOTE — Telephone Encounter (Signed)
Pt is requesting a refill for methylphenidate (RITALIN) 10 MG tablet.  Pharmacy: [facility]

## 2023-10-28 ENCOUNTER — Other Ambulatory Visit: Payer: Self-pay | Admitting: Adult Health

## 2023-10-28 DIAGNOSIS — F988 Other specified behavioral and emotional disorders with onset usually occurring in childhood and adolescence: Secondary | ICD-10-CM

## 2023-10-29 NOTE — Telephone Encounter (Signed)
 Last visit: 08/08/23  Next visit: 03/11/24  Per epic records last fill:    Rx request sent to [facility] NP

## 2023-10-29 NOTE — Telephone Encounter (Signed)
Pt called wanting to know when this medication will be called in for him. Please advise.  

## 2023-11-26 ENCOUNTER — Other Ambulatory Visit: Payer: Self-pay | Admitting: Adult Health

## 2023-11-26 DIAGNOSIS — F988 Other specified behavioral and emotional disorders with onset usually occurring in childhood and adolescence: Secondary | ICD-10-CM

## 2023-11-27 NOTE — Telephone Encounter (Signed)
 Last seen 08/08/23 and next f/u 03/11/24. Last refilled 10/30/23 #30.

## 2023-12-08 DIAGNOSIS — G894 Chronic pain syndrome: Secondary | ICD-10-CM | POA: Diagnosis not present

## 2023-12-08 DIAGNOSIS — M47816 Spondylosis without myelopathy or radiculopathy, lumbar region: Secondary | ICD-10-CM | POA: Diagnosis not present

## 2023-12-22 DIAGNOSIS — I10 Essential (primary) hypertension: Secondary | ICD-10-CM | POA: Diagnosis not present

## 2023-12-22 DIAGNOSIS — M47816 Spondylosis without myelopathy or radiculopathy, lumbar region: Secondary | ICD-10-CM | POA: Diagnosis not present

## 2023-12-29 ENCOUNTER — Other Ambulatory Visit: Payer: Self-pay | Admitting: Adult Health

## 2023-12-29 DIAGNOSIS — F988 Other specified behavioral and emotional disorders with onset usually occurring in childhood and adolescence: Secondary | ICD-10-CM

## 2023-12-29 NOTE — Telephone Encounter (Signed)
**Note De-identified  [place] Obfuscation** Please advise 

## 2024-01-27 ENCOUNTER — Other Ambulatory Visit: Payer: Self-pay | Admitting: Adult Health

## 2024-01-27 DIAGNOSIS — F988 Other specified behavioral and emotional disorders with onset usually occurring in childhood and adolescence: Secondary | ICD-10-CM

## 2024-02-22 DIAGNOSIS — M47816 Spondylosis without myelopathy or radiculopathy, lumbar region: Secondary | ICD-10-CM | POA: Diagnosis not present

## 2024-02-22 DIAGNOSIS — I10 Essential (primary) hypertension: Secondary | ICD-10-CM | POA: Diagnosis not present

## 2024-02-25 ENCOUNTER — Other Ambulatory Visit: Payer: Self-pay | Admitting: Adult Health

## 2024-02-25 DIAGNOSIS — F988 Other specified behavioral and emotional disorders with onset usually occurring in childhood and adolescence: Secondary | ICD-10-CM

## 2024-02-26 NOTE — Telephone Encounter (Signed)
 Patient is due for a refill on methylphenidate . Patient is up to date on his appointments.  PDMP reviewed.

## 2024-03-10 DIAGNOSIS — G894 Chronic pain syndrome: Secondary | ICD-10-CM | POA: Diagnosis not present

## 2024-03-10 DIAGNOSIS — R7303 Prediabetes: Secondary | ICD-10-CM | POA: Diagnosis not present

## 2024-03-11 ENCOUNTER — Ambulatory Visit: Payer: Medicare Other | Admitting: Adult Health

## 2024-03-11 ENCOUNTER — Encounter: Payer: Self-pay | Admitting: Adult Health

## 2024-03-11 VITALS — BP 106/71 | HR 73 | Ht 72.0 in | Wt 250.0 lb

## 2024-03-11 DIAGNOSIS — F988 Other specified behavioral and emotional disorders with onset usually occurring in childhood and adolescence: Secondary | ICD-10-CM | POA: Diagnosis not present

## 2024-03-11 DIAGNOSIS — G4733 Obstructive sleep apnea (adult) (pediatric): Secondary | ICD-10-CM | POA: Diagnosis not present

## 2024-03-11 NOTE — Progress Notes (Signed)
 PATIENT: Stephen York DOB: 1950/07/05  REASON FOR VISIT: follow up HISTORY FROM: patient  Chief Complaint  Patient presents with   RM 4    Patient is here alone for cpap follow-up. ESS 2      HISTORY OF PRESENT ILLNESS:  TODAY 03/11/24:    Stephen York is a 73 y.o. male with a history of obstructive sleep apnea and ADHD. Returns today for follow-up.  He reports overall he is doing well.  Continues to tolerate the CPAP well.  He currently wears the full face mask.  He remains on Ritalin  for ADHD.  States that this medication has worked well for him.  He has regular follow-ups with his PCP.  Denies heart palpitations.  Blood pressure and heart rate in normal range.  His download indicates that he uses machine nightly for compliance of 100%.  Uses machine greater than 4 hours each night.  Average usage was 8 hours.  His residual AHI is 1.9.  Does not have a significant leak.  Returns today for an evaluation.  08/08/23: Stephen York is a 73 y.o. male with a history of OSA on CPAP and ADHD. Returns today for follow-up. Reports that cpap is working well. Continues to find it beneficial. Remains on ritalin  and that is working well. BP and heart reate in normal range. DL is below.        History:   Stephen York is a 73 y.o. male with a history of OSA on CPAP. Returns today for follow-up. CPAP works well. Ritalin  continues to work well. Denies any new issues. DL shows good compliance- 30/30 days for 100%. Used his machine 8.6 hours on average. Residual AHI 1.7 on 5-15 cmh20. Reports that since he started CPAP he has been sleeping much better.   04/10/22: Stephen York is a 72 year old male with a history of obstructive sleep apnea on CPAP and ADD.  He returns today for follow-up. No longer feeling tired in the afternoons. Can't sleep without CPAP. Continues to take ritalin  10 mg daily. Has better concentration.  CPAP download shows that he uses machine nightly for  compliance of 100%.  He uses machine greater than 4 hours each night.  On average he uses his machine 8/2 hours.  His residual AHI is 1.9.  He denies any new symptoms.  Returns today for evaluation.  10/08/2021: Stephen York is a 73 year old male with a history of obstructive sleep apnea on CPAP.  He returns today for follow-up.  His CPAP download indicates that he uses machine nightly for compliance of 100%.  He uses machine greater than 4 hours 19 out of 30 days for compliance of 63%.  His residual AHI is 5.5 on 5-20 cmH2O.  His leak in the 95th percentile is 42.2 L/min.  He does state that he has a new mask that has been working better for him.  Although he does report that some nights he wakes up in the middle of the night and the pressure is too strong.  He has continued on Ritalin  daily.  He states that he is able to focus better when he uses the medication.  He did not attempt to stop it after our last visit.  He returns today for an evaluation.  03/29/21:Stephen York is a 73 year old male with a history of obstructive sleep apnea on CPAP.  The patient returns today for his initial CPAP visit.  His download indicates that he uses machine 29 out of 30  days for compliance of 97%.  He uses machine greater than 4 hours each night.  On average he uses his machine 8 hours.  His residual AHI is 2.9 on 5 to 20 cm of water.  He does not have a significant leak.  His pressure in the 95th percentile is 10.9 cm of water he reports that he noticed a significant difference in how he feels.  No longer waking up with headaches.  He has been taking Ritalin  daily for ADD.  However he is considering doing a trial off the medication now that he is on CPAP.   REVIEW OF SYSTEMS: Out of a complete 14 system review of symptoms, the patient complains only of the following symptoms, and all other reviewed systems are negative.   ESS 2  ALLERGIES: Allergies  Allergen Reactions   Ezetimibe     Other Reaction(s): muscle  pain     HOME MEDICATIONS: Outpatient Medications Prior to Visit  Medication Sig Dispense Refill   acetaminophen  (TYLENOL ) 500 MG tablet Take 500 mg by mouth every 6 (six) hours as needed. Per pt will take one tablet daily in evening     allopurinol (ZYLOPRIM) 300 MG tablet Take 300 mg by mouth daily.     amLODipine (NORVASC) 10 MG tablet Take 10 mg by mouth daily.     Ascorbic Acid (VITAMIN C PO) Take 1 tablet by mouth daily at 6 (six) AM.     atorvastatin (LIPITOR) 40 MG tablet Take 40 mg by mouth daily.     BIOTIN PO Take 5,000 mg by mouth daily at 6 (six) AM.     Carboxymethylcellulose Sodium (DRY EYE RELIEF OP) Apply to eye as needed.     carvedilol (COREG) 6.25 MG tablet Take 6.25 mg by mouth daily.     Cholecalciferol (VITAMIN D3) 75 MCG (3000 UT) TABS Take 1 tablet by mouth daily.     Coenzyme Q10 (COQ-10) 100 MG CAPS Take 1 capsule by mouth at bedtime.     diphenhydrAMINE (BENADRYL) 25 mg capsule Take 25 mg by mouth at bedtime as needed for sleep.     diphenhydramine-acetaminophen  (TYLENOL  PM) 25-500 MG TABS tablet Take 1 tablet by mouth at bedtime as needed.     doxycycline (MONODOX) 100 MG capsule TAKE ONE CAPSULE BY MOUTH DAILY EVER MORNING WITH FOOD FOR ROSACEA     esomeprazole (NEXIUM) 20 MG capsule Take 20 mg by mouth daily at 12 noon.     ferrous sulfate 325 (65 FE) MG tablet Take 325 mg by mouth daily with breakfast.     HYDROcodone -acetaminophen  (NORCO/VICODIN) 5-325 MG tablet Take 1 tablet by mouth every 12 (twelve) hours as needed.     lisinopril (ZESTRIL) 40 MG tablet Take 40 mg by mouth daily.     MAGNESIUM PO Take 20 mg by mouth daily at 6 (six) AM.     Melatonin 10 MG CAPS Take 1 capsule by mouth at bedtime.     Menthol, Topical Analgesic, (BLUE GEL EX) Apply topically as needed.     methocarbamol  (ROBAXIN ) 500 MG tablet Take 500 mg by mouth every 12 (twelve) hours.     methylphenidate  (RITALIN ) 10 MG tablet TAKE ONE TABLET ONCE DAILY 30 tablet 0   metroNIDAZOLE  (METROGEL) 1 % gel Apply 1 Application topically daily.     mupirocin ointment (BACTROBAN) 2 % Apply 1 Application topically 2 (two) times daily.     naloxone (NARCAN) nasal spray 4 mg/0.1 mL Place 1 spray into the  nose as needed.     Omega-3 Fatty Acids (FISH OIL) 1000 MG CAPS Take 2 capsules by mouth 2 (two) times daily.     Potassium 99 MG TABS Take 1 tablet by mouth daily.     sertraline (ZOLOFT) 100 MG tablet Take 100 mg by mouth at bedtime.     vitamin E 1000 UNIT capsule Take 400 Units by mouth daily.      No facility-administered medications prior to visit.    PAST MEDICAL HISTORY: Past Medical History:  Diagnosis Date   ADD (attention deficit hyperactivity disorder, inattentive type)    Anemia    on iron at this time (07/26/2022)   Arthritis    L ankle   Chronic gout    04-20-2021  pt stated effect bilateral great toe's,  last episode several months ago   Chronic low back pain    Chronic sinusitis    Degenerative disc disease, lumbar    lower back, nack, bilateral kness   GERD (gastroesophageal reflux disease)    Hiatal hernia    History of kidney stones 03/25/2011   passed it   History of substance use    pt stated stopped 1981 at age 66   Hyperlipidemia    on meds   Hypertension    followed by pcp   (04-20-2021  pt stated he had a stress test approx. 2007, told ok)-on meds   OA (osteoarthritis)    hands, feets, shoulders, knees   OSA on CPAP    followed by dr dohmeier;   sleep study in epic 01-29-2021 severe osa uses cpap nightly   Personal history of alcoholism (HCC)    pt stated stopped 30 at age 11   Pre-diabetes    PTSD (post-traumatic stress disorder)    Seasonal allergies    Trigger finger, left    ring    PAST SURGICAL HISTORY: Past Surgical History:  Procedure Laterality Date   FOOT SURGERY Bilateral 2002   approx.  ;     great toe both feet for arthritis   KNEE ARTHROSCOPY Left 2016   LUMBAR FUSION  1995   L4-5   LUMBAR MICRODISCECTOMY      1988  L4-5;  redo 1991 L4-5   SHOULDER ARTHROSCOPY Left 2008   approx   TRIGGER FINGER RELEASE Right 06/2020   ring finger   TRIGGER FINGER RELEASE Left 04/26/2021   Procedure: RELEASE TRIGGER FINGER/A-1 PULLEY;  Surgeon: Alyse Agent, MD;  Location: [facility];  Service: Orthopedics;  Laterality: Left;  with LOCAL   WISDOM TOOTH EXTRACTION      FAMILY HISTORY: Family History  Problem Relation Age of Onset   Aneurysm Mother    Alcohol abuse Father    Sleep apnea Sister    Colon polyps Neg Hx    Colon cancer Neg Hx    Esophageal cancer Neg Hx    Rectal cancer Neg Hx    Stomach cancer Neg Hx     SOCIAL HISTORY: Social History   Socioeconomic History   Marital status: Married    Spouse name: Furniture conservator/restorer   Number of children: 2   Years of education: 12   Highest education level: Not on file  Occupational History   Occupation: retired  Tobacco Use   Smoking status: Former    Current packs/day: 0.00    Average packs/day: 2.0 packs/day for 30.0 years (60.0 ttl pk-yrs)    Types: Cigarettes    Start date: 09/30/1962    Quit  date: 09/29/1992    Years since quitting: 31.4   Smokeless tobacco: Never  Vaping Use   Vaping status: Never Used  Substance and Sexual Activity   Alcohol use: Not Currently    Comment: history alcoholism in remission quit in 93 (age 66)   Drug use: Not Currently    Types: Marijuana, Mescaline, Other-see comments    Comment: pt stated stopped  pt stopped 44 (age 60)   Sexual activity: Not on file  Other Topics Concern   Not on file  Social History Narrative   Caffeine 3 cups in am,    Married, 2 kids. Retired.     Right handed   Social Drivers of Corporate investment banker Strain: Not on file  Food Insecurity: Not on file  Transportation Needs: Not on file  Physical Activity: Not on file  Stress: Not on file  Social Connections: Not on file  Intimate Partner Violence: Not on file      PHYSICAL EXAM  Vitals:    03/11/24 1253  BP: 106/71  Pulse: 73  Weight: 250 lb (113.4 kg)  Height: 6' (1.829 m)    Body mass index is 33.91 kg/m.  Generalized: Well developed, in no acute distress  Chest: Lungs clear to auscultation bilaterally  Neurological examination  Mentation: Alert oriented to time, place, history taking. Follows all commands speech and language fluent Cranial nerve II-XII: Extraocular movements were full, visual field were full on confrontational test Head turning and shoulder shrug  were normal and symmetric. Gait and station: Gait is normal.    DIAGNOSTIC DATA (LABS, IMAGING, TESTING) - I reviewed patient records, labs, notes, testing and imaging myself where available.  Lab Results  Component Value Date   HGB 14.3 04/26/2021   HCT 42.0 04/26/2021      Component Value Date/Time   NA 138 04/26/2021 0707   K 3.9 04/26/2021 0707   CL 104 04/26/2021 0707   CO2 24 08/31/2018 1017   GLUCOSE 115 (H) 04/26/2021 0707   BUN 29 (H) 04/26/2021 0707   CREATININE 0.90 04/26/2021 0707   CALCIUM 9.6 08/31/2018 1017   GFRNONAA >60 08/31/2018 1017   GFRAA >60 08/31/2018 1017      ASSESSMENT AND PLAN 73 y.o. year old male  has a past medical history of ADD (attention deficit hyperactivity disorder, inattentive type), Anemia, Arthritis, Chronic gout, Chronic low back pain, Chronic sinusitis, Degenerative disc disease, lumbar, GERD (gastroesophageal reflux disease), Hiatal hernia, History of kidney stones (03/25/2011), History of substance use, Hyperlipidemia, Hypertension, OA (osteoarthritis), OSA on CPAP, Personal history of alcoholism (HCC), Pre-diabetes, PTSD (post-traumatic stress disorder), Seasonal allergies, and Trigger finger, left. here with:  OSA on CPAP ADHD  - CPAP compliance excellent - Good treatment of AHI  - Encourage patient to use CPAP nightly and > 4 hours each night - Continue Ritalin  10 mg daily - F/U in 6-8 months or sooner if needed    Duwaine Russell, MSN,  NP-C 03/11/2024, 1:01 PM [facility] [address], [address] [place], [place] [address] [phone]  The patient's condition requires frequent monitoring and adjustments in the treatment plan, reflecting the ongoing complexity of care.  This provider is the continuing focal point for all needed services for this condition.

## 2024-03-11 NOTE — Patient Instructions (Signed)
 Continue using CPAP nightly and greater than 4 hours each night Continue Ritalin  If your symptoms worsen or you develop new symptoms please let us know.

## 2024-03-23 DIAGNOSIS — M47816 Spondylosis without myelopathy or radiculopathy, lumbar region: Secondary | ICD-10-CM | POA: Diagnosis not present

## 2024-03-23 DIAGNOSIS — I10 Essential (primary) hypertension: Secondary | ICD-10-CM | POA: Diagnosis not present

## 2024-03-27 ENCOUNTER — Other Ambulatory Visit: Payer: Self-pay | Admitting: Adult Health

## 2024-03-27 DIAGNOSIS — F988 Other specified behavioral and emotional disorders with onset usually occurring in childhood and adolescence: Secondary | ICD-10-CM

## 2024-05-01 ENCOUNTER — Other Ambulatory Visit: Payer: Self-pay | Admitting: Adult Health

## 2024-05-01 DIAGNOSIS — F988 Other specified behavioral and emotional disorders with onset usually occurring in childhood and adolescence: Secondary | ICD-10-CM

## 2024-05-06 NOTE — Telephone Encounter (Signed)
Pt called wanting to know when this will be called in for him. Please advise.

## 2024-05-07 NOTE — Telephone Encounter (Signed)
 Last refill 03/30/24 Last OV 03/01/24

## 2024-06-01 ENCOUNTER — Other Ambulatory Visit: Payer: Self-pay | Admitting: Adult Health

## 2024-06-01 DIAGNOSIS — F988 Other specified behavioral and emotional disorders with onset usually occurring in childhood and adolescence: Secondary | ICD-10-CM

## 2024-07-02 ENCOUNTER — Other Ambulatory Visit: Payer: Self-pay | Admitting: Adult Health

## 2024-07-02 DIAGNOSIS — F988 Other specified behavioral and emotional disorders with onset usually occurring in childhood and adolescence: Secondary | ICD-10-CM

## 2024-09-09 ENCOUNTER — Ambulatory Visit: Admitting: Adult Health
